# Patient Record
Sex: Male | Born: 1940 | Race: White | Hispanic: No | Marital: Single | Smoking: Former smoker
Health system: Southern US, Community
[De-identification: ages and names within clinical notes are randomized; demographics above are authoritative.]

## PROBLEM LIST (undated history)

## (undated) DIAGNOSIS — J449 Chronic obstructive pulmonary disease, unspecified: Secondary | ICD-10-CM

## (undated) DIAGNOSIS — I10 Essential (primary) hypertension: Secondary | ICD-10-CM

## (undated) DIAGNOSIS — K219 Gastro-esophageal reflux disease without esophagitis: Secondary | ICD-10-CM

## (undated) DIAGNOSIS — M1990 Unspecified osteoarthritis, unspecified site: Secondary | ICD-10-CM

## (undated) DIAGNOSIS — J189 Pneumonia, unspecified organism: Secondary | ICD-10-CM

## (undated) DIAGNOSIS — E7800 Pure hypercholesterolemia, unspecified: Secondary | ICD-10-CM

## (undated) HISTORY — PX: COLONOSCOPY: SHX174

---

## 2012-07-18 HISTORY — PX: PROSTATE SURGERY: SHX751

## 2012-07-21 ENCOUNTER — Emergency Department (HOSPITAL_BASED_OUTPATIENT_CLINIC_OR_DEPARTMENT_OTHER)
Admission: EM | Admit: 2012-07-21 | Discharge: 2012-07-21 | Disposition: A | Discharge status: Home / Self Care | Payer: Medicare Other | Attending: Emergency Medicine | Admitting: Emergency Medicine

## 2012-07-21 ENCOUNTER — Encounter (HOSPITAL_BASED_OUTPATIENT_CLINIC_OR_DEPARTMENT_OTHER): Payer: Self-pay | Admitting: Emergency Medicine

## 2012-07-21 DIAGNOSIS — R002 Palpitations: Secondary | ICD-10-CM | POA: Insufficient documentation

## 2012-07-21 DIAGNOSIS — I959 Hypotension, unspecified: Secondary | ICD-10-CM

## 2012-07-21 DIAGNOSIS — Z87891 Personal history of nicotine dependence: Secondary | ICD-10-CM | POA: Insufficient documentation

## 2012-07-21 DIAGNOSIS — N289 Disorder of kidney and ureter, unspecified: Secondary | ICD-10-CM

## 2012-07-21 DIAGNOSIS — E860 Dehydration: Secondary | ICD-10-CM | POA: Insufficient documentation

## 2012-07-21 DIAGNOSIS — R000 Tachycardia, unspecified: Secondary | ICD-10-CM

## 2012-07-21 DIAGNOSIS — Z8739 Personal history of other diseases of the musculoskeletal system and connective tissue: Secondary | ICD-10-CM | POA: Insufficient documentation

## 2012-07-21 DIAGNOSIS — I10 Essential (primary) hypertension: Secondary | ICD-10-CM | POA: Insufficient documentation

## 2012-07-21 DIAGNOSIS — D649 Anemia, unspecified: Secondary | ICD-10-CM

## 2012-07-21 DIAGNOSIS — E119 Type 2 diabetes mellitus without complications: Secondary | ICD-10-CM | POA: Insufficient documentation

## 2012-07-21 DIAGNOSIS — K219 Gastro-esophageal reflux disease without esophagitis: Secondary | ICD-10-CM | POA: Insufficient documentation

## 2012-07-21 DIAGNOSIS — Z79899 Other long term (current) drug therapy: Secondary | ICD-10-CM | POA: Insufficient documentation

## 2012-07-21 HISTORY — DX: Gastro-esophageal reflux disease without esophagitis: K21.9

## 2012-07-21 HISTORY — DX: Essential (primary) hypertension: I10

## 2012-07-21 HISTORY — DX: Unspecified osteoarthritis, unspecified site: M19.90

## 2012-07-21 LAB — CBC WITH DIFFERENTIAL/PLATELET
Basophils Absolute: 0 10*3/uL (ref 0.0–0.1)
Basophils Relative: 0 % (ref 0–1)
Eosinophils Absolute: 0.3 10*3/uL (ref 0.0–0.7)
HCT: 26.7 % — ABNORMAL LOW (ref 39.0–52.0)
MCHC: 34.1 g/dL (ref 30.0–36.0)
Monocytes Absolute: 1 10*3/uL (ref 0.1–1.0)
Neutro Abs: 8.4 10*3/uL — ABNORMAL HIGH (ref 1.7–7.7)
Neutrophils Relative %: 74 % (ref 43–77)
RDW: 13.1 % (ref 11.5–15.5)

## 2012-07-21 LAB — BASIC METABOLIC PANEL
BUN: 31 mg/dL — ABNORMAL HIGH (ref 6–23)
Calcium: 9.3 mg/dL (ref 8.4–10.5)
GFR calc Af Amer: 40 mL/min — ABNORMAL LOW (ref >90)
GFR calc non Af Amer: 34 mL/min — ABNORMAL LOW (ref >90)
Potassium: 3.9 mEq/L (ref 3.5–5.1)

## 2012-07-21 LAB — GLUCOSE, CAPILLARY: Glucose-Capillary: 136 mg/dL — ABNORMAL HIGH (ref 70–99)

## 2012-07-21 MED ORDER — SODIUM CHLORIDE 0.9 % IV BOLUS (SEPSIS)
1000.0000 mL | Freq: Once | INTRAVENOUS | Status: AC
  Administered 2012-07-21: 1000 mL via INTRAVENOUS

## 2012-07-21 MED ORDER — SODIUM CHLORIDE 0.9 % IV SOLN: Freq: Once | INTRAVENOUS | Status: DC

## 2012-07-21 NOTE — ED Notes (Signed)
Pt d/c prior to infusion needed.  Pt rec'd bolus

## 2012-07-21 NOTE — ED Notes (Signed)
Antony Contras, NP for [facility] called to return a page by pt or family member.

## 2012-07-21 NOTE — ED Provider Notes (Addendum)
History   This chart was scribed for Richard Booze, MD by Sofie Rower. The patient was seen in room MH04/MH04 and the patient's care was started at 6:47PM    CSN: [id number]  Arrival date & time 07/21/12  1737   First MD Initiated Contact with Patient 07/21/12 1847      Chief Complaint  Patient presents with  . Tachycardia  . Hypotension    (Consider location/radiation/quality/duration/timing/severity/associated sxs/prior treatment) The history is provided by the patient. No language interpreter was used.    Kaitlin Ardito is a 71 y.o. male , with a hx of reduction of the prostate on 07/18/12 , who presents to the Emergency Department complaining of sudden, progressively worsening, tachycardia, onset today, with associated symptoms of dizziness and nausea. The pt reports he woke up this morning and let his dogs out, he sat down, and woke up three hours later at 12:00 noon. The pt informs that later this afternoon, he took his blood pressure before going out, and noticed it was low along with a rapid heart rate of 112. The pt has a hx of diabetes and hypertension.   The pt denies chest pain and difficulty breathing.   The pt does not smoke or drink alcohol.   PCP is Dr. Tresa Endo.    Past Medical History  Diagnosis Date  . Diabetes mellitus   . Hypertension   . Arthritis   . Acid reflux     Past Surgical History  Procedure Date  . Prostate surgery Sept. 5 2013    History reviewed. No pertinent family history.  History  Substance Use Topics  . Smoking status: Former Smoker -- 3.0 packs/day for 25 years    Types: Cigarettes    Quit date: 07/22/1983  . Smokeless tobacco: Not on file  . Alcohol Use:       Review of Systems  All other systems reviewed and are negative.    Allergies  Sulfa antibiotics  Home Medications   Current Outpatient Rx  Name Route Sig Dispense Refill  . ACETAMINOPHEN-CODEINE #3 300-30 MG PO TABS Oral Take 1-2 tablets by mouth every 6 (six) hours as  needed. For pain    . AZELASTINE HCL 137 MCG/SPRAY NA SOLN Nasal Place 2 sprays into the nose daily. Use in each nostril as directed    . CIPROFLOXACIN HCL 500 MG PO TABS Oral Take 500 mg by mouth 2 (two) times daily. For 10 days starting 07/20/12    . CLOTRIMAZOLE-BETAMETHASONE 1-0.05 % EX CREA Topical Apply 1 application topically 3 (three) times daily.    . CYCLOBENZAPRINE HCL 10 MG PO TABS Oral Take 5-10 mg by mouth 3 (three) times daily as needed. For back spasms    . STOOL SOFTENER PO Oral Take 1 capsule by mouth daily.    Marland Kitchen ESCITALOPRAM OXALATE 10 MG PO TABS Oral Take 10 mg by mouth daily.    Marland Kitchen FIBER PO TABS Oral Take 1 tablet by mouth daily.    Marland Kitchen FLUTICASONE PROPIONATE 50 MCG/ACT NA SUSP Nasal Place 2 sprays into the nose daily.    Marland Kitchen LEVOTHYROXINE SODIUM 50 MCG PO TABS Oral Take 50 mcg by mouth daily.    Marland Kitchen LISINOPRIL-HYDROCHLOROTHIAZIDE 10-12.5 MG PO TABS Oral Take 1 tablet by mouth daily.    Marland Kitchen LORATADINE 10 MG PO TABS Oral Take 10 mg by mouth daily.    Marland Kitchen LOVASTATIN 20 MG PO TABS Oral Take 20 mg by mouth at bedtime.    Marland Kitchen METFORMIN HCL 500  MG PO TABS Oral Take 500 mg by mouth daily.    Marland Kitchen OMEPRAZOLE 20 MG PO CPDR Oral Take 20 mg by mouth daily.    . OXYBUTYNIN CHLORIDE 5 MG PO TABS Oral Take 5 mg by mouth 2 (two) times daily.    Georgiann Mccoy COLD & COUGH PO Oral Take 1 capsule by mouth every 8 (eight) hours as needed. For cough and cold    . METAMUCIL PO Oral Take 15 g by mouth daily.    Marland Kitchen TAMSULOSIN HCL 0.4 MG PO CAPS Oral Take 0.4 mg by mouth at bedtime.    Marland Kitchen ZOLPIDEM TARTRATE 10 MG PO TABS Oral Take 10 mg by mouth at bedtime as needed. For sleep      BP 88/61  Pulse 116  Temp 98.4 F (36.9 C) (Oral)  Resp 20  SpO2 94%  Physical Exam  Nursing note and vitals reviewed. Constitutional: He is oriented to person, place, and time. He appears well-developed and well-nourished.  HENT:  Head: Atraumatic.  Nose: Nose normal.  Eyes: Conjunctivae and EOM are normal.  Neck: Normal range of  motion. Neck supple.  Cardiovascular: Normal rate, regular rhythm and normal heart sounds.   Pulmonary/Chest: Effort normal and breath sounds normal.  Abdominal: Soft. Bowel sounds are normal.  Genitourinary:       Foley catheter in place straining bloody urine.   Musculoskeletal: Normal range of motion.  Neurological: He is alert and oriented to person, place, and time.  Skin: Skin is warm and dry.  Psychiatric: He has a normal mood and affect. His behavior is normal.    ED Course  Procedures (including critical care time)  DIAGNOSTIC STUDIES: Oxygen Saturation is 94% on room air, adequate by my interpretation.    COORDINATION OF CARE:    6:54PM- Blood loss following prostate surgery, EKG results, and blood work discussed. Pt agrees to treatment.   9:51PM- Recheck. Laboratory results and follow up with urologist discussed. Pt agrees with treatment.   Results for orders placed during the hospital encounter of 07/21/12  GLUCOSE, CAPILLARY      Component Value Range   Glucose-Capillary 136 (*) 70 - 99 mg/dL  CBC WITH DIFFERENTIAL      Component Value Range   WBC 11.3 (*) 4.0 - 10.5 K/uL   RBC 2.89 (*) 4.22 - 5.81 MIL/uL   Hemoglobin 9.1 (*) 13.0 - 17.0 g/dL   HCT 09.8 (*) 11.9 - 14.7 %   MCV 92.4  78.0 - 100.0 fL   MCH 31.5  26.0 - 34.0 pg   MCHC 34.1  30.0 - 36.0 g/dL   RDW 82.9  56.2 - 13.0 %   Platelets 194  150 - 400 K/uL   Neutrophils Relative 74  43 - 77 %   Neutro Abs 8.4 (*) 1.7 - 7.7 K/uL   Lymphocytes Relative 15  12 - 46 %   Lymphs Abs 1.7  0.7 - 4.0 K/uL   Monocytes Relative 8  3 - 12 %   Monocytes Absolute 1.0  0.1 - 1.0 K/uL   Eosinophils Relative 2  0 - 5 %   Eosinophils Absolute 0.3  0.0 - 0.7 K/uL   Basophils Relative 0  0 - 1 %   Basophils Absolute 0.0  0.0 - 0.1 K/uL   WBC Morphology TOXIC GRANULATION    BASIC METABOLIC PANEL      Component Value Range   Sodium 132 (*) 135 - 145 mEq/L   Potassium 3.9  3.5 - 5.1 mEq/L   Chloride 94 (*) 96 - 112  mEq/L   CO2 24  19 - 32 mEq/L   Glucose, Bld 146 (*) 70 - 99 mg/dL   BUN 31 (*) 6 - 23 mg/dL   Creatinine, Ser 4.69 (*) 0.50 - 1.35 mg/dL   Calcium 9.3  8.4 - 62.9 mg/dL   GFR calc non Af Amer 34 (*) >90 mL/min   GFR calc Af Amer 40 (*) >90 mL/min    Date: 07/21/2012  Rate: 111  Rhythm: sinus tachycardia  QRS Axis: normal  Intervals: normal  ST/T Wave abnormalities: normal  Conduction Disutrbances:none  Narrative Interpretation: Sinus tachycardia, otherwise normal ECG. No prior ECGs available for comparison.  Old EKG Reviewed: none available   1. Dehydration   2. Hypotension   3. Tachycardia   4. Anemia   5. Renal insufficiency       MDM  Hypotension and tachycardia most likely were related to blood loss from recent prostate surgery. He'll be given a fluid bolus and orthostatic vital signs checked.  Orthostatic vital signs showed little change with going from supine to standing. Heart rate has improved and blood pressures improved somewhat with fluid bolus. Workup shows mild anemia, but no prior hemoglobins in our system. Also, he has mild renal insufficiency with no prior values to compare with that. He'll be given a second fluid bolus, and I anticipate sending him home following that.   Following his second liter of IV fluid, blood pressure is up to 102 systolic, and heart rate is down to 100. Hemoglobin has come back 9.1 which is not unexpected following prostate surgery. He will need to follow up with his urologist. Has an appointment for 3 days from now, but is advised to contact his urologist tomorrow and let him know what the hemoglobin was tonight.   I personally performed the services described in this documentation, which was scribed in my presence. The recorded information has been reviewed and considered.      Richard Booze, MD 07/21/12 [id number]  Richard Booze, MD 07/21/12 2156

## 2012-07-21 NOTE — ED Notes (Addendum)
Pt complains of low BP and tachycardia since this morning. Recent prostate reduction (Sept. 5 ).

## 2014-12-18 ENCOUNTER — Emergency Department (HOSPITAL_BASED_OUTPATIENT_CLINIC_OR_DEPARTMENT_OTHER): Payer: No Typology Code available for payment source

## 2014-12-18 ENCOUNTER — Encounter (HOSPITAL_BASED_OUTPATIENT_CLINIC_OR_DEPARTMENT_OTHER): Payer: Self-pay | Admitting: Emergency Medicine

## 2014-12-18 ENCOUNTER — Emergency Department (HOSPITAL_BASED_OUTPATIENT_CLINIC_OR_DEPARTMENT_OTHER)
Admission: EM | Admit: 2014-12-18 | Discharge: 2014-12-18 | Disposition: A | Discharge status: Home / Self Care | Payer: No Typology Code available for payment source | Attending: Emergency Medicine | Admitting: Emergency Medicine

## 2014-12-18 DIAGNOSIS — Z792 Long term (current) use of antibiotics: Secondary | ICD-10-CM | POA: Diagnosis not present

## 2014-12-18 DIAGNOSIS — Z8739 Personal history of other diseases of the musculoskeletal system and connective tissue: Secondary | ICD-10-CM | POA: Diagnosis not present

## 2014-12-18 DIAGNOSIS — Z87891 Personal history of nicotine dependence: Secondary | ICD-10-CM | POA: Insufficient documentation

## 2014-12-18 DIAGNOSIS — E119 Type 2 diabetes mellitus without complications: Secondary | ICD-10-CM | POA: Diagnosis not present

## 2014-12-18 DIAGNOSIS — S0990XA Unspecified injury of head, initial encounter: Secondary | ICD-10-CM | POA: Insufficient documentation

## 2014-12-18 DIAGNOSIS — E780 Pure hypercholesterolemia: Secondary | ICD-10-CM | POA: Insufficient documentation

## 2014-12-18 DIAGNOSIS — K219 Gastro-esophageal reflux disease without esophagitis: Secondary | ICD-10-CM | POA: Insufficient documentation

## 2014-12-18 DIAGNOSIS — Y92410 Unspecified street and highway as the place of occurrence of the external cause: Secondary | ICD-10-CM | POA: Diagnosis not present

## 2014-12-18 DIAGNOSIS — Y998 Other external cause status: Secondary | ICD-10-CM | POA: Diagnosis not present

## 2014-12-18 DIAGNOSIS — Y9389 Activity, other specified: Secondary | ICD-10-CM | POA: Diagnosis not present

## 2014-12-18 DIAGNOSIS — Z7951 Long term (current) use of inhaled steroids: Secondary | ICD-10-CM | POA: Diagnosis not present

## 2014-12-18 DIAGNOSIS — I10 Essential (primary) hypertension: Secondary | ICD-10-CM | POA: Insufficient documentation

## 2014-12-18 HISTORY — DX: Pure hypercholesterolemia, unspecified: E78.00

## 2014-12-18 MED ORDER — ACETAMINOPHEN-CODEINE #3 300-30 MG PO TABS: 1.0000 | ORAL_TABLET | Freq: Four times a day (QID) | ORAL | Status: DC | PRN

## 2014-12-18 MED ORDER — ACETAMINOPHEN-CODEINE #3 300-30 MG PO TABS: 2.0000 | ORAL_TABLET | Freq: Once | ORAL | Status: DC

## 2014-12-18 MED ORDER — ACETAMINOPHEN-CODEINE #3 300-30 MG PO TABS
2.0000 | ORAL_TABLET | Freq: Once | ORAL | Status: AC
  Administered 2014-12-18: 2 via ORAL
  Filled 2014-12-18: qty 2
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
## 2014-12-18 NOTE — Discharge Instructions (Signed)
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
## 2014-12-18 NOTE — ED Notes (Signed)
Patient transported to CT 

## 2015-03-16 ENCOUNTER — Encounter (HOSPITAL_BASED_OUTPATIENT_CLINIC_OR_DEPARTMENT_OTHER): Payer: Self-pay | Admitting: Emergency Medicine

## 2015-03-16 ENCOUNTER — Emergency Department (HOSPITAL_BASED_OUTPATIENT_CLINIC_OR_DEPARTMENT_OTHER): Payer: Medicare Other

## 2015-03-16 ENCOUNTER — Inpatient Hospital Stay (HOSPITAL_BASED_OUTPATIENT_CLINIC_OR_DEPARTMENT_OTHER)
Admission: EM | Admit: 2015-03-16 | Discharge: 2015-03-17 | DRG: 202 | Disposition: A | Discharge status: Home / Self Care | Payer: Medicare Other | Attending: Internal Medicine | Admitting: Internal Medicine

## 2015-03-16 DIAGNOSIS — M1990 Unspecified osteoarthritis, unspecified site: Secondary | ICD-10-CM | POA: Diagnosis present

## 2015-03-16 DIAGNOSIS — E119 Type 2 diabetes mellitus without complications: Secondary | ICD-10-CM

## 2015-03-16 DIAGNOSIS — K219 Gastro-esophageal reflux disease without esophagitis: Secondary | ICD-10-CM | POA: Diagnosis present

## 2015-03-16 DIAGNOSIS — E785 Hyperlipidemia, unspecified: Secondary | ICD-10-CM | POA: Diagnosis present

## 2015-03-16 DIAGNOSIS — Z87891 Personal history of nicotine dependence: Secondary | ICD-10-CM

## 2015-03-16 DIAGNOSIS — J9601 Acute respiratory failure with hypoxia: Secondary | ICD-10-CM | POA: Diagnosis present

## 2015-03-16 DIAGNOSIS — J45901 Unspecified asthma with (acute) exacerbation: Secondary | ICD-10-CM | POA: Diagnosis not present

## 2015-03-16 DIAGNOSIS — J9600 Acute respiratory failure, unspecified whether with hypoxia or hypercapnia: Secondary | ICD-10-CM | POA: Diagnosis present

## 2015-03-16 DIAGNOSIS — N183 Chronic kidney disease, stage 3 (moderate): Secondary | ICD-10-CM | POA: Diagnosis present

## 2015-03-16 DIAGNOSIS — I10 Essential (primary) hypertension: Secondary | ICD-10-CM | POA: Diagnosis not present

## 2015-03-16 DIAGNOSIS — D649 Anemia, unspecified: Secondary | ICD-10-CM | POA: Diagnosis present

## 2015-03-16 DIAGNOSIS — E039 Hypothyroidism, unspecified: Secondary | ICD-10-CM | POA: Diagnosis present

## 2015-03-16 DIAGNOSIS — Z79899 Other long term (current) drug therapy: Secondary | ICD-10-CM | POA: Diagnosis not present

## 2015-03-16 DIAGNOSIS — R0602 Shortness of breath: Secondary | ICD-10-CM

## 2015-03-16 DIAGNOSIS — I129 Hypertensive chronic kidney disease with stage 1 through stage 4 chronic kidney disease, or unspecified chronic kidney disease: Secondary | ICD-10-CM | POA: Diagnosis present

## 2015-03-16 DIAGNOSIS — R05 Cough: Secondary | ICD-10-CM | POA: Diagnosis present

## 2015-03-16 DIAGNOSIS — R058 Other specified cough: Secondary | ICD-10-CM | POA: Diagnosis present

## 2015-03-16 LAB — CBC WITH DIFFERENTIAL/PLATELET
BASOS ABS: 0 10*3/uL (ref 0.0–0.1)
Basophils Relative: 0 % (ref 0–1)
EOS ABS: 0.1 10*3/uL (ref 0.0–0.7)
Eosinophils Relative: 1 % (ref 0–5)
HCT: 36.5 % — ABNORMAL LOW (ref 39.0–52.0)
Hemoglobin: 12.6 g/dL — ABNORMAL LOW (ref 13.0–17.0)
LYMPHS ABS: 2.3 10*3/uL (ref 0.7–4.0)
Lymphocytes Relative: 16 % (ref 12–46)
MCH: 30.4 pg (ref 26.0–34.0)
MCHC: 34.5 g/dL (ref 30.0–36.0)
MCV: 88.2 fL (ref 78.0–100.0)
MONO ABS: 1.2 10*3/uL — AB (ref 0.1–1.0)
Monocytes Relative: 8 % (ref 3–12)
Neutro Abs: 10.8 10*3/uL — ABNORMAL HIGH (ref 1.7–7.7)
Neutrophils Relative %: 75 % (ref 43–77)
PLATELETS: 200 10*3/uL (ref 150–400)
RBC: 4.14 MIL/uL — ABNORMAL LOW (ref 4.22–5.81)
RDW: 13.9 % (ref 11.5–15.5)
WBC: 14.4 10*3/uL — ABNORMAL HIGH (ref 4.0–10.5)

## 2015-03-16 LAB — I-STAT ARTERIAL BLOOD GAS, ED
Acid-base deficit: 5 mmol/L — ABNORMAL HIGH (ref 0.0–2.0)
Bicarbonate: 18.4 mEq/L — ABNORMAL LOW (ref 20.0–24.0)
O2 Saturation: 92 %
PCO2 ART: 31.1 mmHg — AB (ref 35.0–45.0)
PH ART: 7.381 (ref 7.350–7.450)
TCO2: 19 mmol/L (ref 0–100)
pO2, Arterial: 66 mmHg — ABNORMAL LOW (ref 80.0–100.0)

## 2015-03-16 LAB — CBG MONITORING, ED: Glucose-Capillary: 128 mg/dL — ABNORMAL HIGH (ref 70–99)

## 2015-03-16 LAB — BASIC METABOLIC PANEL
ANION GAP: 11 (ref 5–15)
BUN: 27 mg/dL — ABNORMAL HIGH (ref 6–20)
CALCIUM: 8.8 mg/dL — AB (ref 8.9–10.3)
CO2: 22 mmol/L (ref 22–32)
Chloride: 97 mmol/L — ABNORMAL LOW (ref 101–111)
Creatinine, Ser: 1.27 mg/dL — ABNORMAL HIGH (ref 0.61–1.24)
GFR calc Af Amer: >60 mL/min (ref >60)
GFR calc non Af Amer: 55 mL/min — ABNORMAL LOW (ref >60)
Glucose, Bld: 150 mg/dL — ABNORMAL HIGH (ref 70–99)
Potassium: 3.7 mmol/L (ref 3.5–5.1)
Sodium: 130 mmol/L — ABNORMAL LOW (ref 135–145)

## 2015-03-16 MED ORDER — ALBUTEROL SULFATE (2.5 MG/3ML) 0.083% IN NEBU
5.0000 mg | INHALATION_SOLUTION | Freq: Once | RESPIRATORY_TRACT | Status: AC
  Administered 2015-03-16: 5 mg via RESPIRATORY_TRACT
  Filled 2015-03-16: qty 6

## 2015-03-16 MED ORDER — DEXTROSE 5 % IV SOLN: 500.0000 mg | Freq: Once | INTRAVENOUS | Status: AC

## 2015-03-16 MED ORDER — PREDNISONE 50 MG PO TABS
60.0000 mg | ORAL_TABLET | Freq: Once | ORAL | Status: AC
  Administered 2015-03-16: 60 mg via ORAL
  Filled 2015-03-16 (×2): qty 1

## 2015-03-16 MED ORDER — DEXTROSE 5 % IV SOLN: 1.0000 g | Freq: Once | INTRAVENOUS | Status: AC

## 2015-03-16 MED ORDER — AZITHROMYCIN 500 MG IV SOLR
INTRAVENOUS | Status: AC
  Administered 2015-03-16: 500 mg
  Filled 2015-03-16: qty 500

## 2015-03-16 MED ORDER — CEFTRIAXONE SODIUM 1 G IJ SOLR
INTRAMUSCULAR | Status: AC
  Administered 2015-03-16: 1000 mg
  Filled 2015-03-16: qty 10

## 2015-03-16 NOTE — ED Notes (Signed)
Ambulated patient per Dr. Fredderick PhenixBelfi. Patient placed on pulse ox and ambulated around department. Patient oxygen saturations were 96% at start of walk and decreased to 82% with noticeable dyspnea with a heart rate of 113. Notified physician.

## 2015-03-16 NOTE — ED Notes (Signed)
Pt unable to do peak flow. Every deep breath patient coughs.

## 2015-03-16 NOTE — ED Provider Notes (Addendum)
CSN: [id number]     Arrival date & time 03/16/15  1801 History  This chart was scribed for Rolan Bucco, MD by Abel Presto, ED Scribe. This patient was seen in room MH11/MH11 and the patient's care was started at 6:30 PM.    Chief Complaint  Patient presents with  . Shortness of Breath    Patient is a 74 y.o. male presenting with shortness of breath. The history is provided by the patient. No language interpreter was used.  Shortness of Breath Associated symptoms: cough and wheezing   Associated symptoms: no abdominal pain, no chest pain, no diaphoresis, no fever, no headaches, no rash and no vomiting    HPI Comments: Richard Collier is a 74 y.o. male with PMHx of DM, HTN, arthritis, and HLD who presents to the Emergency Department complaining of SOB with onset 7 days ago. Pt states he was mowing a lawn at onset. Pt states productive cough began the next day but has partially resolved since. He reports associated chest wall pain from cough which has partially resolved.  Pt has been taking cough syrup for relief. Pt was seen by PCP today for annual physical with XR done (see below) and sent to ED with concern for pneumonia. Per nursing note pt received 1 nebulizer treatment and oxygen during appointment. Pt has been using albuterol inhaler and Advair daily. Pt reports his O2 stats have always been low. Pt denies h/o lung disease, emphysema, asthma, COPD. Pt is not a current smoker. Pt denies fever, vomiting, current chest pain, pain or swelling in bilateral legs.  CXR: done 5/3 "Mediastinum and hilar structures normal. Bilateral diffuse pulmonary interstitial prominence noted consistent pneumonitis. No pleural effusion or pneumothorax. Heart size normal.  IMPRESSION: Bilateral diffuse pulmonary interstitial prominence noted consistent with pneumonitis."   Past Medical History  Diagnosis Date  . Diabetes mellitus   . Hypertension   . Arthritis   . Acid reflux   . High cholesterol    Past  Surgical History  Procedure Laterality Date  . Prostate surgery  Sept. 5 2013  . Colonoscopy     No family history on file. History  Substance Use Topics  . Smoking status: Former Smoker -- 3.00 packs/day for 25 years    Types: Cigarettes    Quit date: 07/22/1983  . Smokeless tobacco: Not on file  . Alcohol Use: No    Review of Systems  Constitutional: Positive for fatigue. Negative for fever, chills and diaphoresis.  HENT: Negative for congestion, rhinorrhea and sneezing.   Eyes: Negative.   Respiratory: Positive for cough, shortness of breath and wheezing. Negative for chest tightness.   Cardiovascular: Negative for chest pain and leg swelling.  Gastrointestinal: Negative for nausea, vomiting, abdominal pain, diarrhea and blood in stool.  Genitourinary: Negative for frequency, hematuria, flank pain and difficulty urinating.  Musculoskeletal: Negative for back pain and arthralgias.  Skin: Negative for rash.  Neurological: Negative for dizziness, speech difficulty, weakness, numbness and headaches.      Allergies  Sulfa antibiotics  Home Medications   Prior to Admission medications   Medication Sig Start Date End Date Taking? Authorizing Provider  acetaminophen-codeine (TYLENOL #3) 300-30 MG per tablet Take 1-2 tablets by mouth every 6 (six) hours as needed (for pain). For pain 12/18/14   Paula Libra, MD  acetaminophen-codeine (TYLENOL #3) 300-30 MG per tablet Take 2 tablets by mouth once. 12/18/14   Paula Libra, MD  azelastine (ASTELIN) 137 MCG/SPRAY nasal spray Place 2 sprays into the nose  daily. Use in each nostril as directed    Historical Provider, MD  ciprofloxacin (CIPRO) 500 MG tablet Take 500 mg by mouth 2 (two) times daily. For 10 days starting 07/20/12    Historical Provider, MD  clotrimazole-betamethasone (LOTRISONE) cream Apply 1 application topically 3 (three) times daily.    Historical Provider, MD  cyclobenzaprine (FLEXERIL) 10 MG tablet Take 5-10 mg by mouth 3  (three) times daily as needed. For back spasms    Historical Provider, MD  Docusate Calcium (STOOL SOFTENER PO) Take 1 capsule by mouth daily.    Historical Provider, MD  escitalopram (LEXAPRO) 10 MG tablet Take 10 mg by mouth daily.    Historical Provider, MD  Fiber TABS Take 1 tablet by mouth daily.    Historical Provider, MD  fluticasone (FLONASE) 50 MCG/ACT nasal spray Place 2 sprays into the nose daily.    Historical Provider, MD  levothyroxine (SYNTHROID, LEVOTHROID) 50 MCG tablet Take 50 mcg by mouth daily.    Historical Provider, MD  lisinopril-hydrochlorothiazide (PRINZIDE,ZESTORETIC) 10-12.5 MG per tablet Take 1 tablet by mouth daily.    Historical Provider, MD  loratadine (CLARITIN) 10 MG tablet Take 10 mg by mouth daily.    Historical Provider, MD  lovastatin (MEVACOR) 20 MG tablet Take 20 mg by mouth at bedtime.    Historical Provider, MD  metFORMIN (GLUCOPHAGE) 500 MG tablet Take 500 mg by mouth daily.    Historical Provider, MD  omeprazole (PRILOSEC) 20 MG capsule Take 20 mg by mouth daily.    Historical Provider, MD  oxybutynin (DITROPAN) 5 MG tablet Take 5 mg by mouth 2 (two) times daily.    Historical Provider, MD  Pseudoephedrine-APAP-DM (COMTREX COLD & COUGH PO) Take 1 capsule by mouth every 8 (eight) hours as needed. For cough and cold    Historical Provider, MD  Psyllium (METAMUCIL PO) Take 15 g by mouth daily.    Historical Provider, MD  Tamsulosin HCl (FLOMAX) 0.4 MG CAPS Take 0.4 mg by mouth at bedtime.    Historical Provider, MD  zolpidem (AMBIEN) 10 MG tablet Take 10 mg by mouth at bedtime as needed. For sleep    Historical Provider, MD   BP 138/81 mmHg  Pulse 105  Temp(Src) 99.1 F (37.3 C) (Oral)  Resp 18  Ht  (1.803 m)  Wt 233 lb (105.688 kg)  BMI 32.51 kg/m2  SpO2 94% Physical Exam  Constitutional: He is oriented to person, place, and time. He appears well-developed and well-nourished.  HENT:  Head: Normocephalic and atraumatic.  Eyes: Pupils are  equal, round, and reactive to light.  Neck: Normal range of motion. Neck supple.  Cardiovascular: Normal rate, regular rhythm and normal heart sounds.   Pulmonary/Chest: Effort normal. No respiratory distress. He has wheezes. He has no rales. He exhibits no tenderness.  Positive rhonchi bilaterally with some moderate expiratory wheezing. There some mild increased work of breathing  Abdominal: Soft. Bowel sounds are normal. There is no tenderness. There is no rebound and no guarding.  Musculoskeletal: Normal range of motion. He exhibits edema.  Patient has trace edema bilaterally. No calf tenderness  Lymphadenopathy:    He has no cervical adenopathy.  Neurological: He is alert and oriented to person, place, and time.  Skin: Skin is warm and dry. No rash noted.  Psychiatric: He has a normal mood and affect.    ED Course  Procedures (including critical care time) DIAGNOSTIC STUDIES: Oxygen Saturation is 93% on room air, adequate by my interpretation.  COORDINATION OF CARE: 6:42 PM Discussed treatment plan with patient at beside, the patient agrees with the plan and has no further questions at this time.   Labs Review Labs Reviewed  BASIC METABOLIC PANEL - Abnormal; Notable for the following:    Sodium 130 (*)    Chloride 97 (*)    Glucose, Bld 150 (*)    BUN 27 (*)    Creatinine, Ser 1.27 (*)    Calcium 8.8 (*)    GFR calc non Af Amer 55 (*)    All other components within normal limits  CBC WITH DIFFERENTIAL/PLATELET - Abnormal; Notable for the following:    WBC 14.4 (*)    RBC 4.14 (*)    Hemoglobin 12.6 (*)    HCT 36.5 (*)    Neutro Abs 10.8 (*)    Monocytes Absolute 1.2 (*)    All other components within normal limits  CBG MONITORING, ED - Abnormal; Notable for the following:    Glucose-Capillary 128 (*)    All other components within normal limits  I-STAT ARTERIAL BLOOD GAS, ED - Abnormal; Notable for the following:    pCO2 arterial 31.1 (*)    pO2, Arterial 66.0 (*)     Bicarbonate 18.4 (*)    Acid-base deficit 5.0 (*)    All other components within normal limits    Imaging Review No results found.   EKG Interpretation   Date/Time:  Tuesday Mar 16 2015 23:14:36 EDT Ventricular Rate:  105 PR Interval:  200 QRS Duration: 100 QT Interval:  336 QTC Calculation: 444 R Axis:   4 Text Interpretation:  Sinus tachycardia Otherwise normal ECG since last  tracing no significant change Confirmed by Shaeleigh Graw  MD, Priscella Donna ([id number]) on  03/16/2015 11:16:44 PM     Results for orders placed or performed during the hospital encounter of 03/16/15  Basic metabolic panel  Result Value Ref Range   Sodium 130 (L) 135 - 145 mmol/L   Potassium 3.7 3.5 - 5.1 mmol/L   Chloride 97 (L) 101 - 111 mmol/L   CO2 22 22 - 32 mmol/L   Glucose, Bld 150 (H) 70 - 99 mg/dL   BUN 27 (H) 6 - 20 mg/dL   Creatinine, Ser 2.951.27 (H) 0.61 - 1.24 mg/dL   Calcium 8.8 (L) 8.9 - 10.3 mg/dL   GFR calc non Af Amer 55 (L) >60 mL/min   GFR calc Af Amer >60 >60 mL/min   Anion gap 11 5 - 15  CBC with Differential  Result Value Ref Range   WBC 14.4 (H) 4.0 - 10.5 K/uL   RBC 4.14 (L) 4.22 - 5.81 MIL/uL   Hemoglobin 12.6 (L) 13.0 - 17.0 g/dL   HCT 62.136.5 (L) 30.839.0 - 65.752.0 %   MCV 88.2 78.0 - 100.0 fL   MCH 30.4 26.0 - 34.0 pg   MCHC 34.5 30.0 - 36.0 g/dL   RDW 84.613.9 96.211.5 - 95.215.5 %   Platelets 200 150 - 400 K/uL   Neutrophils Relative % 75 43 - 77 %   Lymphocytes Relative 16 12 - 46 %   Monocytes Relative 8 3 - 12 %   Eosinophils Relative 1 0 - 5 %   Basophils Relative 0 0 - 1 %   Neutro Abs 10.8 (H) 1.7 - 7.7 K/uL   Lymphs Abs 2.3 0.7 - 4.0 K/uL   Monocytes Absolute 1.2 (H) 0.1 - 1.0 K/uL   Eosinophils Absolute 0.1 0.0 - 0.7 K/uL   Basophils Absolute  0.0 0.0 - 0.1 K/uL  CBG monitoring, ED  Result Value Ref Range   Glucose-Capillary 128 (H) 70 - 99 mg/dL  I-Stat arterial blood gas, ED  Result Value Ref Range   pH, Arterial 7.381 7.350 - 7.450   pCO2 arterial 31.1 (L) 35.0 - 45.0 mmHg    pO2, Arterial 66.0 (L) 80.0 - 100.0 mmHg   Bicarbonate 18.4 (L) 20.0 - 24.0 mEq/L   TCO2 19 0 - 100 mmol/L   O2 Saturation 92.0 %   Acid-base deficit 5.0 (H) 0.0 - 2.0 mmol/L   Patient temperature 99.0 F    Collection site IV START    Drawn by RT    Sample type ARTERIAL    No results found.   Meds ordered this encounter  Medications  . predniSONE (DELTASONE) tablet 60 mg    Sig:   . albuterol (PROVENTIL) (2.5 MG/3ML) 0.083% nebulizer solution 5 mg    Sig:   . albuterol (PROVENTIL) (2.5 MG/3ML) 0.083% nebulizer solution 5 mg    Sig:   . cefTRIAXone (ROCEPHIN) 1 g in dextrose 5 % 50 mL IVPB    Sig:   . azithromycin (ZITHROMAX) 500 mg in dextrose 5 % 250 mL IVPB    Sig:   . azithromycin (ZITHROMAX) 500 MG injection    Sig:     Marcum, Ruth   : cabinet override  . cefTRIAXone (ROCEPHIN) 1 G injection    Sig:     Marcum, Ruth   : cabinet override  . albuterol (PROVENTIL) (2.5 MG/3ML) 0.083% nebulizer solution 5 mg    Sig:     MDM   Final diagnoses:  Asthma exacerbation   Patient presents with wheezing and cough. He's afebrile. He initially did not want to be admitted however he continued to have some mild hypoxia. Even at rest with oxygen saturations would go down to about 87%. He states his baseline sats are low but it's unclear what his baseline sats actually are. However when he ambulates his oxygen saturations drop down 82%. This was even after getting several nebulizer treatments in the ED. He was also given prednisone. He was treated with Zithromax and Rocephin for possible infection. I spoke with Dr. Park Breed with the hospital service who is accepted the patient for transfer to [facility].  I personally performed the services described in this documentation, which was scribed in my presence.  The recorded information has been reviewed and considered.     Rolan Bucco, MD 03/16/15 1610  Rolan Bucco, MD 03/16/15 [id number]

## 2015-03-16 NOTE — ED Notes (Signed)
MD at bedside discussing admission with pt

## 2015-03-16 NOTE — ED Notes (Signed)
Placed patient on 2 liters nasal cannula patient oxygen saturations were 88% placed patient on 2 liters patient oxygen saturations >90%

## 2015-03-16 NOTE — ED Notes (Signed)
Ambulated patient via pulse ox. Patient saturations ranged from 93% to 83% with exertion and dyspnea. Patient returned to room and physician notified

## 2015-03-16 NOTE — ED Notes (Signed)
74 yo c/o SOB and dx with possible Pneumonia from PCP. Pt received 1 nebulizer tx and oxygen at the appointment. Denies Pain and is alert and ambulatory at triage.

## 2015-03-16 NOTE — ED Notes (Signed)
MD at bedside. 

## 2015-03-17 ENCOUNTER — Inpatient Hospital Stay (HOSPITAL_COMMUNITY): Payer: Medicare Other

## 2015-03-17 ENCOUNTER — Encounter (HOSPITAL_COMMUNITY): Payer: Self-pay | Admitting: Internal Medicine

## 2015-03-17 DIAGNOSIS — J9600 Acute respiratory failure, unspecified whether with hypoxia or hypercapnia: Secondary | ICD-10-CM | POA: Diagnosis present

## 2015-03-17 DIAGNOSIS — I10 Essential (primary) hypertension: Secondary | ICD-10-CM | POA: Diagnosis present

## 2015-03-17 DIAGNOSIS — R058 Other specified cough: Secondary | ICD-10-CM | POA: Diagnosis present

## 2015-03-17 DIAGNOSIS — D649 Anemia, unspecified: Secondary | ICD-10-CM | POA: Diagnosis present

## 2015-03-17 DIAGNOSIS — E039 Hypothyroidism, unspecified: Secondary | ICD-10-CM | POA: Diagnosis present

## 2015-03-17 DIAGNOSIS — E119 Type 2 diabetes mellitus without complications: Secondary | ICD-10-CM

## 2015-03-17 DIAGNOSIS — J9601 Acute respiratory failure with hypoxia: Secondary | ICD-10-CM

## 2015-03-17 DIAGNOSIS — E785 Hyperlipidemia, unspecified: Secondary | ICD-10-CM | POA: Diagnosis present

## 2015-03-17 DIAGNOSIS — R05 Cough: Secondary | ICD-10-CM | POA: Diagnosis present

## 2015-03-17 DIAGNOSIS — J45901 Unspecified asthma with (acute) exacerbation: Principal | ICD-10-CM

## 2015-03-17 LAB — URINALYSIS, ROUTINE W REFLEX MICROSCOPIC
BILIRUBIN URINE: NEGATIVE
Glucose, UA: NEGATIVE mg/dL
Hgb urine dipstick: NEGATIVE
KETONES UR: NEGATIVE mg/dL
Leukocytes, UA: NEGATIVE
Nitrite: NEGATIVE
PH: 5.5 (ref 5.0–8.0)
Protein, ur: 30 mg/dL — AB
Specific Gravity, Urine: 1.012 (ref 1.005–1.030)
Urobilinogen, UA: 0.2 mg/dL (ref 0.0–1.0)

## 2015-03-17 LAB — CBC
HCT: 34.4 % — ABNORMAL LOW (ref 39.0–52.0)
Hemoglobin: 11.9 g/dL — ABNORMAL LOW (ref 13.0–17.0)
MCH: 29.8 pg (ref 26.0–34.0)
MCHC: 34.6 g/dL (ref 30.0–36.0)
MCV: 86.2 fL (ref 78.0–100.0)
Platelets: 209 10*3/uL (ref 150–400)
RBC: 3.99 MIL/uL — AB (ref 4.22–5.81)
RDW: 14.1 % (ref 11.5–15.5)
WBC: 16.4 10*3/uL — ABNORMAL HIGH (ref 4.0–10.5)

## 2015-03-17 LAB — CREATININE, SERUM
Creatinine, Ser: 1.23 mg/dL (ref 0.61–1.24)
GFR, EST NON AFRICAN AMERICAN: 57 mL/min — AB (ref >60)

## 2015-03-17 LAB — GLUCOSE, CAPILLARY
Glucose-Capillary: 185 mg/dL — ABNORMAL HIGH (ref 70–99)
Glucose-Capillary: 175 mg/dL — ABNORMAL HIGH (ref 70–99)

## 2015-03-17 LAB — STREP PNEUMONIAE URINARY ANTIGEN: STREP PNEUMO URINARY ANTIGEN: NEGATIVE

## 2015-03-17 LAB — URINE MICROSCOPIC-ADD ON

## 2015-03-17 LAB — MRSA PCR SCREENING: MRSA by PCR: NEGATIVE

## 2015-03-17 LAB — PROCALCITONIN: Procalcitonin: 0.47 ng/mL

## 2015-03-17 LAB — BRAIN NATRIURETIC PEPTIDE: B Natriuretic Peptide: 56.6 pg/mL (ref 0.0–100.0)

## 2015-03-17 MED ORDER — SODIUM CHLORIDE 0.9 % IJ SOLN: 3.0000 mL | Freq: Two times a day (BID) | INTRAMUSCULAR | Status: DC

## 2015-03-17 MED ORDER — GUAIFENESIN-CODEINE 100-10 MG/5ML PO SYRP: 5.0000 mL | ORAL_SOLUTION | Freq: Three times a day (TID) | ORAL | Status: AC | PRN

## 2015-03-17 MED ORDER — LEVOTHYROXINE SODIUM 50 MCG PO TABS
50.0000 ug | ORAL_TABLET | Freq: Every day | ORAL | Status: DC
  Filled 2015-03-17 (×2): qty 1

## 2015-03-17 MED ORDER — ZOLPIDEM TARTRATE 5 MG PO TABS: 5.0000 mg | ORAL_TABLET | Freq: Every evening | ORAL | Status: DC | PRN

## 2015-03-17 MED ORDER — LEVOFLOXACIN 750 MG PO TABS: 750.0000 mg | ORAL_TABLET | Freq: Every day | ORAL | Status: DC

## 2015-03-17 MED ORDER — ALBUTEROL SULFATE HFA 108 (90 BASE) MCG/ACT IN AERS: 2.0000 | INHALATION_SPRAY | Freq: Four times a day (QID) | RESPIRATORY_TRACT | Status: DC | PRN

## 2015-03-17 MED ORDER — ONDANSETRON HCL 4 MG PO TABS: 4.0000 mg | ORAL_TABLET | Freq: Four times a day (QID) | ORAL | Status: DC | PRN

## 2015-03-17 MED ORDER — BUDESONIDE 0.25 MG/2ML IN SUSP
0.2500 mg | Freq: Two times a day (BID) | RESPIRATORY_TRACT | Status: DC
  Administered 2015-03-17: 0.25 mg via RESPIRATORY_TRACT
  Filled 2015-03-17 (×3): qty 2

## 2015-03-17 MED ORDER — PREDNISONE 10 MG PO TABS: ORAL_TABLET | ORAL | Status: DC

## 2015-03-17 MED ORDER — ESCITALOPRAM OXALATE 10 MG PO TABS
10.0000 mg | ORAL_TABLET | Freq: Every day | ORAL | Status: DC
  Administered 2015-03-17: 10 mg via ORAL
  Filled 2015-03-17: qty 1

## 2015-03-17 MED ORDER — LORATADINE 10 MG PO TABS
10.0000 mg | ORAL_TABLET | Freq: Every day | ORAL | Status: DC
  Administered 2015-03-17: 10 mg via ORAL
  Filled 2015-03-17: qty 1

## 2015-03-17 MED ORDER — CYCLOBENZAPRINE HCL 10 MG PO TABS: 5.0000 mg | ORAL_TABLET | Freq: Three times a day (TID) | ORAL | Status: DC | PRN

## 2015-03-17 MED ORDER — PANTOPRAZOLE SODIUM 40 MG PO TBEC
40.0000 mg | DELAYED_RELEASE_TABLET | Freq: Every day | ORAL | Status: DC
  Administered 2015-03-17: 40 mg via ORAL
  Filled 2015-03-17: qty 1

## 2015-03-17 MED ORDER — ALBUTEROL SULFATE (2.5 MG/3ML) 0.083% IN NEBU
2.5000 mg | INHALATION_SOLUTION | Freq: Three times a day (TID) | RESPIRATORY_TRACT | Status: DC
  Filled 2015-03-17: qty 3

## 2015-03-17 MED ORDER — GUAIFENESIN-CODEINE 100-10 MG/5ML PO SOLN
10.0000 mL | Freq: Four times a day (QID) | ORAL | Status: DC | PRN
  Filled 2015-03-17: qty 10

## 2015-03-17 MED ORDER — PREDNISONE 20 MG PO TABS
20.0000 mg | ORAL_TABLET | Freq: Every day | ORAL | Status: DC
  Administered 2015-03-17: 20 mg via ORAL
  Filled 2015-03-17 (×2): qty 1

## 2015-03-17 MED ORDER — ALBUTEROL SULFATE (2.5 MG/3ML) 0.083% IN NEBU: 2.5000 mg | INHALATION_SOLUTION | RESPIRATORY_TRACT | Status: DC | PRN

## 2015-03-17 MED ORDER — OXYBUTYNIN CHLORIDE 5 MG PO TABS
5.0000 mg | ORAL_TABLET | Freq: Two times a day (BID) | ORAL | Status: DC
  Filled 2015-03-17 (×2): qty 1

## 2015-03-17 MED ORDER — FLUTICASONE-SALMETEROL 250-50 MCG/DOSE IN AEPB: 1.0000 | INHALATION_SPRAY | Freq: Two times a day (BID) | RESPIRATORY_TRACT | Status: AC

## 2015-03-17 MED ORDER — LISINOPRIL-HYDROCHLOROTHIAZIDE 10-12.5 MG PO TABS
1.0000 | ORAL_TABLET | Freq: Every day | ORAL | Status: DC
Start: 2015-03-17 — End: 2015-03-17

## 2015-03-17 MED ORDER — TAMSULOSIN HCL 0.4 MG PO CAPS
0.4000 mg | ORAL_CAPSULE | Freq: Every day | ORAL | Status: DC
  Filled 2015-03-17: qty 1

## 2015-03-17 MED ORDER — DOCUSATE SODIUM 100 MG PO CAPS: 100.0000 mg | ORAL_CAPSULE | Freq: Every day | ORAL | Status: DC

## 2015-03-17 MED ORDER — ENOXAPARIN SODIUM 40 MG/0.4ML ~~LOC~~ SOLN
40.0000 mg | SUBCUTANEOUS | Status: DC
  Administered 2015-03-17: 40 mg via SUBCUTANEOUS
  Filled 2015-03-17 (×2): qty 0.4

## 2015-03-17 MED ORDER — FLUTICASONE PROPIONATE 50 MCG/ACT NA SUSP
2.0000 | Freq: Every day | NASAL | Status: DC
  Filled 2015-03-17: qty 16

## 2015-03-17 MED ORDER — PRAVASTATIN SODIUM 20 MG PO TABS
20.0000 mg | ORAL_TABLET | Freq: Every day | ORAL | Status: DC
  Filled 2015-03-17: qty 1

## 2015-03-17 MED ORDER — ACETAMINOPHEN 650 MG RE SUPP: 650.0000 mg | Freq: Four times a day (QID) | RECTAL | Status: DC | PRN

## 2015-03-17 MED ORDER — DEXTROSE 5 % IV SOLN
500.0000 mg | INTRAVENOUS | Status: DC
  Filled 2015-03-17: qty 500

## 2015-03-17 MED ORDER — CETYLPYRIDINIUM CHLORIDE 0.05 % MT LIQD
7.0000 mL | Freq: Two times a day (BID) | OROMUCOSAL | Status: DC
  Administered 2015-03-17: 7 mL via OROMUCOSAL

## 2015-03-17 MED ORDER — GUAIFENESIN 100 MG/5ML PO SYRP
200.0000 mg | ORAL_SOLUTION | Freq: Four times a day (QID) | ORAL | Status: DC | PRN
  Filled 2015-03-17: qty 10

## 2015-03-17 MED ORDER — DEXTROSE 5 % IV SOLN
1.0000 g | INTRAVENOUS | Status: DC
  Filled 2015-03-17: qty 10

## 2015-03-17 MED ORDER — HYDROCHLOROTHIAZIDE 12.5 MG PO CAPS
12.5000 mg | ORAL_CAPSULE | Freq: Every day | ORAL | Status: DC
  Administered 2015-03-17: 12.5 mg via ORAL
  Filled 2015-03-17: qty 1

## 2015-03-17 MED ORDER — ACETAMINOPHEN 325 MG PO TABS
650.0000 mg | ORAL_TABLET | Freq: Four times a day (QID) | ORAL | Status: DC | PRN
  Administered 2015-03-17: 650 mg via ORAL
  Filled 2015-03-17: qty 2

## 2015-03-17 MED ORDER — ONDANSETRON HCL 4 MG/2ML IJ SOLN: 4.0000 mg | Freq: Four times a day (QID) | INTRAMUSCULAR | Status: DC | PRN

## 2015-03-17 MED ORDER — AZELASTINE HCL 0.1 % NA SOLN
2.0000 | Freq: Every day | NASAL | Status: DC
  Filled 2015-03-17: qty 30

## 2015-03-17 MED ORDER — ALBUTEROL SULFATE (2.5 MG/3ML) 0.083% IN NEBU
2.5000 mg | INHALATION_SOLUTION | RESPIRATORY_TRACT | Status: DC
  Administered 2015-03-17 (×2): 2.5 mg via RESPIRATORY_TRACT
  Filled 2015-03-17 (×2): qty 3

## 2015-03-17 MED ORDER — FUROSEMIDE 10 MG/ML IJ SOLN
20.0000 mg | Freq: Once | INTRAMUSCULAR | Status: AC
  Administered 2015-03-17: 20 mg via INTRAVENOUS
  Filled 2015-03-17: qty 2

## 2015-03-17 MED ORDER — INSULIN ASPART 100 UNIT/ML ~~LOC~~ SOLN
0.0000 [IU] | Freq: Three times a day (TID) | SUBCUTANEOUS | Status: DC
  Administered 2015-03-17: 1 [IU] via SUBCUTANEOUS

## 2015-03-17 MED ORDER — LISINOPRIL 10 MG PO TABS
10.0000 mg | ORAL_TABLET | Freq: Every day | ORAL | Status: DC
  Administered 2015-03-17: 10 mg via ORAL
  Filled 2015-03-17: qty 1

## 2015-03-17 NOTE — H&P (Signed)
[facility] History and Physical  Richard PodRay Guttierrez ZOX:[id number] DOB: [birth year] DOA: 03/16/2015  Referring physician: Dr. Fredderick PhenixBelfi. Patient was transferred from [facility] [place]. PCP: Almedia BallsKELLY,SAM, MD  Specialists: None.  Chief Complaint: Persistent cough.  HPI: Richard Collier is a 74 y.o. male with history of diabetes mellitus type 2, hypertension, hyperlipidemia, hypothyroidism has been expressing persistent cough over the last 1 week. Patient's symptoms started last week after mowing. Patient states he has been having persistent productive cough and had gone to urgent care twice. Yesterday patient went to his PCP and had chest x-ray done which showed possible pneumonia and was referred to the ER. In the ER patient was found to be hypoxic and was started on nebulizer and antibiotics for possible pneumonia and asthma exacerbation. Patient also was placed on steroids. On my exam at this time patient is not actively wheezing. Patient states that over the last 1 week patient also notices that he gets increasing cough on lying down and on exam patient also has lower extremity edema. Patient states she has gained 15 pounds over the last few weeks. Denies any chest pain nausea vomiting abdominal pain or diarrhea.   Review of Systems: As presented in the history of presenting illness, rest negative.  Past Medical History  Diagnosis Date  . Diabetes mellitus   . Hypertension   . Arthritis   . Acid reflux   . High cholesterol    Past Surgical History  Procedure Laterality Date  . Prostate surgery  Sept. 5 2013  . Colonoscopy     Social History:  reports that he quit smoking about 31 years ago. His smoking use included Cigarettes. He has a 75 pack-year smoking history. He does not have any smokeless tobacco history on file. He reports that he does not drink alcohol or use illicit drugs. Where does patient live home. Can patient participate in ADLs? Yes.  Allergies  Allergen Reactions  . Sulfa  Antibiotics Other (See Comments)    unknown    Family History:  Family History  Problem Relation Age of Onset  . Diabetes Mellitus II Father   . CAD Sister       Prior to Admission medications   Medication Sig Start Date End Date Taking? Authorizing Provider  acetaminophen-codeine (TYLENOL #3) 300-30 MG per tablet Take 1-2 tablets by mouth every 6 (six) hours as needed (for pain). For pain 12/18/14   Paula LibraJohn Molpus, MD  acetaminophen-codeine (TYLENOL #3) 300-30 MG per tablet Take 2 tablets by mouth once. 12/18/14   Paula LibraJohn Molpus, MD  azelastine (ASTELIN) 137 MCG/SPRAY nasal spray Place 2 sprays into the nose daily. Use in each nostril as directed    Historical Provider, MD  ciprofloxacin (CIPRO) 500 MG tablet Take 500 mg by mouth 2 (two) times daily. For 10 days starting 07/20/12    Historical Provider, MD  clotrimazole-betamethasone (LOTRISONE) cream Apply 1 application topically 3 (three) times daily.    Historical Provider, MD  cyclobenzaprine (FLEXERIL) 10 MG tablet Take 5-10 mg by mouth 3 (three) times daily as needed. For back spasms    Historical Provider, MD  Docusate Calcium (STOOL SOFTENER PO) Take 1 capsule by mouth daily.    Historical Provider, MD  escitalopram (LEXAPRO) 10 MG tablet Take 10 mg by mouth daily.    Historical Provider, MD  Fiber TABS Take 1 tablet by mouth daily.    Historical Provider, MD  fluticasone (FLONASE) 50 MCG/ACT nasal spray Place 2 sprays into the nose daily.  Historical Provider, MD  levothyroxine (SYNTHROID, LEVOTHROID) 50 MCG tablet Take 50 mcg by mouth daily.    Historical Provider, MD  lisinopril-hydrochlorothiazide (PRINZIDE,ZESTORETIC) 10-12.5 MG per tablet Take 1 tablet by mouth daily.    Historical Provider, MD  loratadine (CLARITIN) 10 MG tablet Take 10 mg by mouth daily.    Historical Provider, MD  lovastatin (MEVACOR) 20 MG tablet Take 20 mg by mouth at bedtime.    Historical Provider, MD  metFORMIN (GLUCOPHAGE) 500 MG tablet Take 500 mg by mouth  daily.    Historical Provider, MD  omeprazole (PRILOSEC) 20 MG capsule Take 20 mg by mouth daily.    Historical Provider, MD  oxybutynin (DITROPAN) 5 MG tablet Take 5 mg by mouth 2 (two) times daily.    Historical Provider, MD  Pseudoephedrine-APAP-DM (COMTREX COLD & COUGH PO) Take 1 capsule by mouth every 8 (eight) hours as needed. For cough and cold    Historical Provider, MD  Psyllium (METAMUCIL PO) Take 15 g by mouth daily.    Historical Provider, MD  Tamsulosin HCl (FLOMAX) 0.4 MG CAPS Take 0.4 mg by mouth at bedtime.    Historical Provider, MD  zolpidem (AMBIEN) 10 MG tablet Take 10 mg by mouth at bedtime as needed. For sleep    Historical Provider, MD    Physical Exam: Filed Vitals:   03/16/15 1807 03/17/15 0052 03/17/15 0158 03/17/15 0343  BP:  149/88 140/83 129/78  Pulse:  106 96 91  Temp:  98.5 F (36.9 C) 98.6 F (37 C) 98.3 F (36.8 C)  TempSrc:  Oral Oral Oral  Resp:  Height:  (1.803 m)   (1.803 m)   Weight: 105.688 kg (233 lb)  87.7 kg (193 lb 5.5 oz)   SpO2:  95% 95% 93%     General:  Well-developed and nourished.  Eyes: Anicteric no pallor.  ENT: No discharge from the ears eyes nose and mouth.  Neck: JVD not appreciated. No mass felt.  Cardiovascular: S1 and S2 heard.  Respiratory: No rhonchi or crepitations.  Abdomen: Soft nontender bowel sounds present.  Skin: No rash.  Musculoskeletal: Bilateral lower extremity edema.  Psychiatric: Appears normal.  Neurologic: Alert awake oriented to time place and person. Moves all extremities.  Labs on Admission:  Basic Metabolic Panel:  Recent Labs Lab 03/16/15 1825  NA 130*  K 3.7  CL 97*  CO2 22  GLUCOSE 150*  BUN 27*  CREATININE 1.27*  CALCIUM 8.8*   Liver Function Tests: No results for input(s): AST, ALT, ALKPHOS, BILITOT, PROT, ALBUMIN in the last 168 hours. No results for input(s): LIPASE, AMYLASE in the last 168 hours. No results for input(s): AMMONIA in the last 168  hours. CBC:  Recent Labs Lab 03/16/15 1825  WBC 14.4*  NEUTROABS 10.8*  HGB 12.6*  HCT 36.5*  MCV 88.2  PLT 200   Cardiac Enzymes: No results for input(s): CKTOTAL, CKMB, CKMBINDEX, TROPONINI in the last 168 hours.  BNP (last 3 results) No results for input(s): BNP in the last 8760 hours.  ProBNP (last 3 results) No results for input(s): PROBNP in the last 8760 hours.  CBG:  Recent Labs Lab 03/16/15 1851  GLUCAP 128*    Radiological Exams on Admission: Dg Chest Port 1 View  03/17/2015   CLINICAL DATA:  Dyspnea and cough  EXAM: PORTABLE CHEST - 1 VIEW  COMPARISON:  None.  FINDINGS: There is a shallow inspiration. No confluent airspace consolidation is evident. There  is no large effusion. There is mild crowding of the basilar markings which is likely due to the shallow inspiration.  IMPRESSION: No acute cardiopulmonary findings.   Electronically Signed   By: Ellery Plunkaniel R Mitchell M.D.   On: 03/17/2015 03:34    EKG: Independently reviewed. Sinus tachycardia.  Assessment/Plan Principal Problem:   Acute respiratory failure with hypoxia Active Problems:   Asthma exacerbation   Productive cough   Hypothyroidism   Essential hypertension   Diabetes mellitus type 2, controlled   Normocytic anemia   Hyperlipidemia   Acute respiratory failure   1. Acute respiratory failure with hypoxia - cause not clear. On my exam patient is on wheezing. Chest x-ray done over ER does not show any definite infiltrates or congestion. At this time we will continue with nebulizer prednisone and antibiotics. I have ordered procalcitonin levels and if procalcitonin levels and negative may discontinue antibiotics. Since patient has lower extremity edema and has symptoms when lying down I have ordered 1 dose of Lasix 20 mg IV. Check BNP and 2-D echo for LV assessment. Closely follow intake and output daily weights. 2. Diabetes mellitus type 2 - continue home medications with sliding scale  coverage. 3. Hypertension - continue home medications. 4. Hypothyroidism - check TSH. Continue Synthroid. 5. Renal failure not sure if it is acute or chronic baseline creatinine not available closely follow intake and output and metabolic panel. 6. Normocytic anemia - patient states he has had colonoscopy 2 years ago and is scheduled for one next year. Follow CBC closely. 7. Hyperlipidemia continue home medications.   DVT Prophylaxis Lovenox.  Code Status: Full code.  Family Communication: Discussed with patient.  Disposition Plan: Admit to inpatient. Likely stay 2-3 days.    Leaha Cuervo N. [facility] Pager [phone].  If 7PM-7AM, please contact night-coverage [email or web address] Password [email or web address] 03/17/2015, 4:17 AM

## 2015-03-17 NOTE — Progress Notes (Signed)
Dc'd via [employer],started to give discharge instructions, refused discharge instructions. States, I know all those medication and I will call Dr. Deborha PaymentSam Kelly's office to make appointment.  No complaints.

## 2015-03-17 NOTE — Progress Notes (Addendum)
Has medications from home and refuse to let nurse take to pharmacy.  States, I used some of my medication early this am. One taken was nasal spray Ipratropium bromide and synthroid.  Explained to patient he does  not need to take medications due to possible overdose.  Refused to listen and states he took Robitussin with codeine.  MD notified and aware.  Continue to watch. 

## 2015-03-17 NOTE — Progress Notes (Addendum)
Patient claims to be much better-less sob and cough. Lungs clear on exam. Insisting on discharge, he knows he probably should stay another 24 hours-but adamantly refused. Risks of worsening PNA, sepsis, resp failure, cardiac arrest and other risks causing death or significant disability explained in detail. He accepts all risks and will be discharged home at his home request.Have asked him to follow up with PCP within one week, have asked him to seek immediate medical attention if SOB worsens, fever, chest pain. He understands.Note-he is completely awake and alert, oriented X4

## 2015-03-17 NOTE — Discharge Summary (Signed)
PATIENT DETAILS Name: Richard Collier Age: 74 y.o. Sex: male Date of Birth: 08-Nov-1941 MRN: [id number]. Admitting Physician: Yevonne PaxSaadat A Khan, MD NFA:OZHYQ,MVHPCP:KELLY,SAM, MD  Admit Date: 03/16/2015 Discharge date: 03/17/2015  Recommendations for Outpatient Follow-up:  1. Repeat chest x-ray in 6-8 weeks to document resolution of pneumonia 2. Repeat CBC and chemistries in 1-2 weeks or at next follow-up with PCP.  PRIMARY DISCHARGE DIAGNOSIS:  Principal Problem:   Acute respiratory failure with hypoxia Active Problems:   Asthma exacerbation   Productive cough   Hypothyroidism   Essential hypertension   Diabetes mellitus type 2, controlled   Normocytic anemia   Hyperlipidemia   Acute respiratory failure      PAST MEDICAL HISTORY: Past Medical History  Diagnosis Date  . Diabetes mellitus   . Hypertension   . Arthritis   . Acid reflux   . High cholesterol     DISCHARGE MEDICATIONS: Current Discharge Medication List    START taking these medications   Details  albuterol (PROVENTIL HFA;VENTOLIN HFA) 108 (90 BASE) MCG/ACT inhaler Inhale 2 puffs into the lungs every 6 (six) hours as needed for wheezing or shortness of breath. Qty: 1 Inhaler, Refills: 0    albuterol (PROVENTIL) (2.5 MG/3ML) 0.083% nebulizer solution Take 3 mLs (2.5 mg total) by nebulization every 2 (two) hours as needed for wheezing. Qty: 75 mL, Refills: 0    Fluticasone-Salmeterol (ADVAIR DISKUS) 250-50 MCG/DOSE AEPB Inhale 1 puff into the lungs 2 (two) times daily. Qty: 60 each, Refills: 0    guaiFENesin-codeine (ROBITUSSIN AC) 100-10 MG/5ML syrup Take 5 mLs by mouth 3 (three) times daily as needed for cough. Qty: 118 mL, Refills: 0    levofloxacin (LEVAQUIN) 750 MG tablet Take 1 tablet (750 mg total) by mouth daily. Qty: 5 tablet, Refills: 0    predniSONE (DELTASONE) 10 MG tablet Take 4 tablets (40 mg) daily for 2 days, then, Take 3 tablets (30 mg) daily for 2 days, then, Take 2 tablets (20 mg) daily for 2 days,  then, Take 1 tablets (10 mg) daily for 1 days, then stop Qty: 19 tablet, Refills: 0      CONTINUE these medications which have NOT CHANGED   Details  azelastine (ASTELIN) 137 MCG/SPRAY nasal spray Place 2 sprays into the nose daily. Use in each nostril as directed    clotrimazole-betamethasone (LOTRISONE) cream Apply 1 application topically 3 (three) times daily.    cyclobenzaprine (FLEXERIL) 10 MG tablet Take 5-10 mg by mouth 3 (three) times daily as needed. For back spasms    Docusate Calcium (STOOL SOFTENER PO) Take 1 capsule by mouth daily.    escitalopram (LEXAPRO) 10 MG tablet Take 10 mg by mouth daily.    Fiber TABS Take 1 tablet by mouth daily.    fluticasone (FLONASE) 50 MCG/ACT nasal spray Place 2 sprays into the nose daily.    levothyroxine (SYNTHROID, LEVOTHROID) 50 MCG tablet Take 50 mcg by mouth daily.    lisinopril-hydrochlorothiazide (PRINZIDE,ZESTORETIC) 10-12.5 MG per tablet Take 1 tablet by mouth daily.    loratadine (CLARITIN) 10 MG tablet Take 10 mg by mouth daily.    lovastatin (MEVACOR) 20 MG tablet Take 20 mg by mouth at bedtime.    metFORMIN (GLUCOPHAGE) 500 MG tablet Take 500 mg by mouth daily.    omeprazole (PRILOSEC) 20 MG capsule Take 20 mg by mouth daily.    oxybutynin (DITROPAN) 5 MG tablet Take 5 mg by mouth 2 (two) times daily.    Pseudoephedrine-APAP-DM (COMTREX COLD & COUGH  PO) Take 1 capsule by mouth every 8 (eight) hours as needed. For cough and cold    Psyllium (METAMUCIL PO) Take 15 g by mouth daily.    Tamsulosin HCl (FLOMAX) 0.4 MG CAPS Take 0.4 mg by mouth at bedtime.    zolpidem (AMBIEN) 10 MG tablet Take 10 mg by mouth at bedtime as needed. For sleep      STOP taking these medications     acetaminophen-codeine (TYLENOL #3) 300-30 MG per tablet      acetaminophen-codeine (TYLENOL #3) 300-30 MG per tablet      ciprofloxacin (CIPRO) 500 MG tablet         ALLERGIES:   Allergies  Allergen Reactions  . Sulfa Antibiotics  Other (See Comments)    unknown    BRIEF HPI:  See H&P, Labs, Consult and Test reports for all details in brief, patient is a 74 y.o. male with history of diabetes mellitus type 2, hypertension, hyperlipidemia, hypothyroidism who presented with persistent cough and shortness of breath. He was subsequently admitted for further evaluation and treatment  CONSULTATIONS:   None  PERTINENT RADIOLOGIC STUDIES: Dg Chest 2 View  03/17/2015   CLINICAL DATA:  Shortness of breath and cough, history of asthma and  EXAM: CHEST  2 VIEW  COMPARISON:  Portable chest x-ray of Mar 17, 2015 and PA and lateral chest of October 30, 2013.  FINDINGS: The lungs are well-expanded. The interstitial markings are coarse especially in the mid and lower lung. There is subsegmental atelectasis at both lung bases posteriorly. The heart and pulmonary vascularity are normal. The mediastinum is normal in width. There is no pleural effusion. The bony thorax is unremarkable.  IMPRESSION: COPD-reactive airway disease. Pulmonary interstitial edema or infiltrate at the lung bases has improved slightly. There is persistent subsegmental atelectasis.   Electronically Signed   By: David  [place] M.D.   On: 03/17/2015 07:57   Dg Chest Port 1 View  03/17/2015   CLINICAL DATA:  Dyspnea and cough  EXAM: PORTABLE CHEST - 1 VIEW  COMPARISON:  None.  FINDINGS: There is a shallow inspiration. No confluent airspace consolidation is evident. There is no large effusion. There is mild crowding of the basilar markings which is likely due to the shallow inspiration.  IMPRESSION: No acute cardiopulmonary findings.   Electronically Signed   By: Ellery Plunk M.D.   On: 03/17/2015 03:34     PERTINENT LAB RESULTS: CBC:  Recent Labs  03/16/15 1825 03/17/15 0630  WBC 14.4* 16.4*  HGB 12.6* 11.9*  HCT 36.5* 34.4*  PLT 200 209   CMET CMP     Component Value Date/Time   NA 130* 03/16/2015 1825   K 3.7 03/16/2015 1825   CL 97* 03/16/2015 1825    CO2 22 03/16/2015 1825   GLUCOSE 150* 03/16/2015 1825   BUN 27* 03/16/2015 1825   CREATININE 1.23 03/17/2015 0630   CALCIUM 8.8* 03/16/2015 1825   GFRNONAA 57* 03/17/2015 0630   GFRAA >60 03/17/2015 0630    GFR Estimated Creatinine Clearance: 58.7 mL/min (by C-G formula based on Cr of 1.23). No results for input(s): LIPASE, AMYLASE in the last 72 hours. No results for input(s): CKTOTAL, CKMB, CKMBINDEX, TROPONINI in the last 72 hours. Invalid input(s): POCBNP No results for input(s): DDIMER in the last 72 hours. No results for input(s): HGBA1C in the last 72 hours. No results for input(s): CHOL, HDL, LDLCALC, TRIG, CHOLHDL, LDLDIRECT in the last 72 hours. No results for input(s): TSH, T4TOTAL, T3FREE,  THYROIDAB in the last 72 hours.  Invalid input(s): FREET3 No results for input(s): VITAMINB12, FOLATE, FERRITIN, TIBC, IRON, RETICCTPCT in the last 72 hours. Coags: No results for input(s): INR in the last 72 hours.  Invalid input(s): PT Microbiology: Recent Results (from the past 240 hour(s))  MRSA PCR Screening     Status: None   Collection Time: 03/17/15  3:06 AM  Result Value Ref Range Status   MRSA by PCR NEGATIVE NEGATIVE Final    Comment:        The GeneXpert MRSA Assay (FDA approved for NASAL specimens only), is one component of a comprehensive MRSA colonization surveillance program. It is not intended to diagnose MRSA infection nor to guide or monitor treatment for MRSA infections.      BRIEF HOSPITAL COURSE:   Principal Problem:   Acute respiratory failure with hypoxia: Suspect this is secondary to acute asthmatic bronchitis. Per patient, he takes albuterol/Advair at home, suspect he has a history of reactive airway disease-likely asthma. He was admitted and started on steroids, antibiotics, nebulized bronchodilators. He felt significantly better this morning, and was insisting on discharge-patient is being discharged home at his own request. Results of  significant worsening causing disability and that has been discussed with the patient, he is understands and is accepting of these risks Please see above regarding discharge medication.  Active Problems:   Suspected acute asthmatic bronchitis: Treated with steroids, antibiotics and nebulized bronchodilators. Significantly improved, cough is remarkably better. On exam, there is good air entry, still has some scattered rhonchi. I did recommend to the patient that he stay another day to make sure he is optimized and stable for discharge, however he was insisting on going home. He is being discharged home at his own request. He has been instructed to seek immediate medical attention if he has fever, worsening shortness of breath, chest pain.  Type 2 diabetes: Continue with metformin on discharge. CBGs were stable during this hospital stay.  Essential hypertension: Blood pressure was controlled-continue with usual antihypertensive regimen on discharge  Hypothyroidism: Continue with levothyroxine  Normocytic anemia: Follow CBC closely as an outpatient. Per patient he had a colonoscopy 2 years back and is scheduled for another one next year.  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  TODAY-DAY OF DISCHARGE:  Subjective:   Richard Collier today has no headache,no chest abdominal pain,no new weakness tingling or numbness, feels much better wants to go home today.   Objective:   Blood pressure 148/77, pulse 84, temperature 98.5 F (36.9 C), temperature source Oral, resp. rate 16, height 5\' 11"  (1.803 m), weight 87.7 kg (193 lb 5.5 oz), SpO2 95 %.  Intake/Output Summary (Last 24 hours) at 03/17/15 0948 Last data filed at 03/17/15 0900  Gross per 24 hour  Intake    800 ml  Output   1400 ml  Net   -600 ml   Filed Weights   03/16/15 1807 03/17/15 0158  Weight: 105.688 kg (233 lb) 87.7 kg (193 lb 5.5 oz)    Exam Awake Alert, Oriented *3, No new F.N  deficits, Normal affect [place].AT,PERRAL Supple Neck,No JVD, No cervical lymphadenopathy appriciated.  Symmetrical Chest wall movement, Good air movement bilaterally, CTAB-with scattered rhonchi RRR,No Gallops,Rubs or new Murmurs, No Parasternal Heave +ve B.Sounds, Abd Soft, Non tender, No organomegaly appriciated, No rebound -guarding or rigidity. No Cyanosis, Clubbing or edema, No new Rash or bruise  DISCHARGE CONDITION: Stable  DISPOSITION: Home  DISCHARGE INSTRUCTIONS:    Activity:  As tolerated  Diet recommendation: Diabetic Diet Heart Healthy diet  Discharge Instructions    Call MD for:  difficulty breathing, headache or visual disturbances    Complete by:  As directed      Call MD for:  temperature >100.4    Complete by:  As directed      Diet - low sodium heart healthy    Complete by:  As directed      Diet Carb Modified    Complete by:  As directed      Increase activity slowly    Complete by:  As directed           Follow-up Information    Follow up with [facility], MD. Schedule an appointment as soon as possible for a visit in 1 week.   Specialty:  Family Medicine   Contact information:   [address] [facility] [address] [phone]      Total Time spent on discharge equals  45 minutes.  SignedJeoffrey Massed 03/17/2015 9:48 AM

## 2015-03-17 NOTE — Progress Notes (Signed)
Patient asking for more Robitussin but not time. Reached in bag and took more of his personal Robitussin.  Explained it  was not time for Robitussin but states, I can take it every 4-6 hours.  To be discharged.  Awaiting case management to get patient's nebulizer for home use.  Continue to watch.

## 2015-03-17 NOTE — Progress Notes (Signed)
Eating lunch, taxi called by Child psychotherapistocial Worker, patient refused to take Taxi at this time. States, "I will be ready in 30 minutes. Taxi waiting outside, Social worker notified taxi patient not ready.  Will call taxi when ready.

## 2015-03-17 NOTE — Progress Notes (Signed)
Has medications from home and refuse to let nurse take to pharmacy.  States, I used some of my medication early this am. One taken was nasal spray Ipratropium bromide and synthroid.  Explained to patient he does  not need to take medications due to possible overdose.  Refused to listen and states he took Robitussin with codeine.  MD notified and aware.  Continue to watch.

## 2015-03-18 LAB — LEGIONELLA ANTIGEN, URINE

## 2015-03-19 ENCOUNTER — Emergency Department (HOSPITAL_BASED_OUTPATIENT_CLINIC_OR_DEPARTMENT_OTHER): Payer: Medicare Other

## 2015-03-19 ENCOUNTER — Encounter (HOSPITAL_BASED_OUTPATIENT_CLINIC_OR_DEPARTMENT_OTHER): Payer: Self-pay

## 2015-03-19 ENCOUNTER — Inpatient Hospital Stay (HOSPITAL_COMMUNITY): Payer: Medicare Other

## 2015-03-19 ENCOUNTER — Other Ambulatory Visit (HOSPITAL_COMMUNITY): Payer: Medicare Other

## 2015-03-19 ENCOUNTER — Inpatient Hospital Stay (HOSPITAL_BASED_OUTPATIENT_CLINIC_OR_DEPARTMENT_OTHER)
Admission: EM | Admit: 2015-03-19 | Discharge: 2015-03-24 | DRG: 640 | Disposition: A | Discharge status: Home / Self Care | Payer: Medicare Other | Attending: Internal Medicine | Admitting: Internal Medicine

## 2015-03-19 ENCOUNTER — Emergency Department (HOSPITAL_BASED_OUTPATIENT_CLINIC_OR_DEPARTMENT_OTHER)
Admission: EM | Admit: 2015-03-19 | Discharge: 2015-03-19 | Discharge status: Absent without leave | Payer: Medicare Other

## 2015-03-19 DIAGNOSIS — Z882 Allergy status to sulfonamides status: Secondary | ICD-10-CM

## 2015-03-19 DIAGNOSIS — E038 Other specified hypothyroidism: Secondary | ICD-10-CM | POA: Diagnosis present

## 2015-03-19 DIAGNOSIS — R7989 Other specified abnormal findings of blood chemistry: Secondary | ICD-10-CM

## 2015-03-19 DIAGNOSIS — N179 Acute kidney failure, unspecified: Secondary | ICD-10-CM | POA: Diagnosis present

## 2015-03-19 DIAGNOSIS — I129 Hypertensive chronic kidney disease with stage 1 through stage 4 chronic kidney disease, or unspecified chronic kidney disease: Secondary | ICD-10-CM | POA: Diagnosis present

## 2015-03-19 DIAGNOSIS — R0789 Other chest pain: Secondary | ICD-10-CM

## 2015-03-19 DIAGNOSIS — R0602 Shortness of breath: Secondary | ICD-10-CM | POA: Diagnosis present

## 2015-03-19 DIAGNOSIS — N1330 Unspecified hydronephrosis: Secondary | ICD-10-CM | POA: Diagnosis present

## 2015-03-19 DIAGNOSIS — K219 Gastro-esophageal reflux disease without esophagitis: Secondary | ICD-10-CM | POA: Diagnosis present

## 2015-03-19 DIAGNOSIS — E119 Type 2 diabetes mellitus without complications: Secondary | ICD-10-CM | POA: Diagnosis present

## 2015-03-19 DIAGNOSIS — E871 Hypo-osmolality and hyponatremia: Principal | ICD-10-CM | POA: Diagnosis present

## 2015-03-19 DIAGNOSIS — R0600 Dyspnea, unspecified: Secondary | ICD-10-CM

## 2015-03-19 DIAGNOSIS — R410 Disorientation, unspecified: Secondary | ICD-10-CM | POA: Diagnosis present

## 2015-03-19 DIAGNOSIS — I9589 Other hypotension: Secondary | ICD-10-CM | POA: Diagnosis present

## 2015-03-19 DIAGNOSIS — Z79899 Other long term (current) drug therapy: Secondary | ICD-10-CM | POA: Diagnosis not present

## 2015-03-19 DIAGNOSIS — J9601 Acute respiratory failure with hypoxia: Secondary | ICD-10-CM | POA: Diagnosis not present

## 2015-03-19 DIAGNOSIS — J9621 Acute and chronic respiratory failure with hypoxia: Secondary | ICD-10-CM | POA: Diagnosis present

## 2015-03-19 DIAGNOSIS — E785 Hyperlipidemia, unspecified: Secondary | ICD-10-CM | POA: Diagnosis present

## 2015-03-19 DIAGNOSIS — J441 Chronic obstructive pulmonary disease with (acute) exacerbation: Secondary | ICD-10-CM | POA: Diagnosis present

## 2015-03-19 DIAGNOSIS — R109 Unspecified abdominal pain: Secondary | ICD-10-CM | POA: Diagnosis present

## 2015-03-19 DIAGNOSIS — N189 Chronic kidney disease, unspecified: Secondary | ICD-10-CM | POA: Diagnosis present

## 2015-03-19 DIAGNOSIS — R0902 Hypoxemia: Secondary | ICD-10-CM

## 2015-03-19 DIAGNOSIS — R1084 Generalized abdominal pain: Secondary | ICD-10-CM | POA: Diagnosis present

## 2015-03-19 DIAGNOSIS — D72829 Elevated white blood cell count, unspecified: Secondary | ICD-10-CM | POA: Diagnosis present

## 2015-03-19 DIAGNOSIS — E039 Hypothyroidism, unspecified: Secondary | ICD-10-CM | POA: Diagnosis present

## 2015-03-19 DIAGNOSIS — I959 Hypotension, unspecified: Secondary | ICD-10-CM | POA: Diagnosis not present

## 2015-03-19 DIAGNOSIS — Z87891 Personal history of nicotine dependence: Secondary | ICD-10-CM

## 2015-03-19 DIAGNOSIS — M1990 Unspecified osteoarthritis, unspecified site: Secondary | ICD-10-CM | POA: Diagnosis present

## 2015-03-19 DIAGNOSIS — Z833 Family history of diabetes mellitus: Secondary | ICD-10-CM | POA: Diagnosis not present

## 2015-03-19 DIAGNOSIS — M7989 Other specified soft tissue disorders: Secondary | ICD-10-CM | POA: Diagnosis not present

## 2015-03-19 LAB — COMPREHENSIVE METABOLIC PANEL
ALT: 31 U/L (ref 17–63)
ALT: 32 U/L (ref 17–63)
AST: 45 U/L — AB (ref 15–41)
AST: 45 U/L — ABNORMAL HIGH (ref 15–41)
Albumin: 3.1 g/dL — ABNORMAL LOW (ref 3.5–5.0)
Albumin: 3.2 g/dL — ABNORMAL LOW (ref 3.5–5.0)
Alkaline Phosphatase: 65 U/L (ref 38–126)
Alkaline Phosphatase: 61 U/L (ref 38–126)
Anion gap: 9 (ref 5–15)
Anion gap: 12 (ref 5–15)
BILIRUBIN TOTAL: 0.5 mg/dL (ref 0.3–1.2)
BUN: 28 mg/dL — ABNORMAL HIGH (ref 6–20)
BUN: 28 mg/dL — ABNORMAL HIGH (ref 6–20)
CALCIUM: 8.3 mg/dL — AB (ref 8.9–10.3)
CO2: 22 mmol/L (ref 22–32)
CO2: 23 mmol/L (ref 22–32)
CREATININE: 1.41 mg/dL — AB (ref 0.61–1.24)
Calcium: 8.5 mg/dL — ABNORMAL LOW (ref 8.9–10.3)
Chloride: 84 mmol/L — ABNORMAL LOW (ref 101–111)
Chloride: 79 mmol/L — ABNORMAL LOW (ref 101–111)
Creatinine, Ser: 1.4 mg/dL — ABNORMAL HIGH (ref 0.61–1.24)
GFR calc Af Amer: 57 mL/min — ABNORMAL LOW (ref >60)
GFR calc non Af Amer: 49 mL/min — ABNORMAL LOW (ref >60)
GFR, EST AFRICAN AMERICAN: 56 mL/min — AB (ref >60)
GFR, EST NON AFRICAN AMERICAN: 49 mL/min — AB (ref >60)
GLUCOSE: 156 mg/dL — AB (ref 70–99)
GLUCOSE: 115 mg/dL — AB (ref 70–99)
POTASSIUM: 4.4 mmol/L (ref 3.5–5.1)
Potassium: 4.5 mmol/L (ref 3.5–5.1)
SODIUM: 115 mmol/L — AB (ref 135–145)
Sodium: 114 mmol/L — CL (ref 135–145)
TOTAL PROTEIN: 6.2 g/dL — AB (ref 6.5–8.1)
Total Bilirubin: 0.5 mg/dL (ref 0.3–1.2)
Total Protein: 6.3 g/dL — ABNORMAL LOW (ref 6.5–8.1)

## 2015-03-19 LAB — BASIC METABOLIC PANEL
Anion gap: 13 (ref 5–15)
BUN: 36 mg/dL — AB (ref 6–20)
CALCIUM: 8.9 mg/dL (ref 8.9–10.3)
CO2: 20 mmol/L — AB (ref 22–32)
CREATININE: 1.36 mg/dL — AB (ref 0.61–1.24)
Chloride: 86 mmol/L — ABNORMAL LOW (ref 101–111)
GFR calc Af Amer: 59 mL/min — ABNORMAL LOW (ref >60)
GFR calc non Af Amer: 51 mL/min — ABNORMAL LOW (ref >60)
Glucose, Bld: 158 mg/dL — ABNORMAL HIGH (ref 70–99)
Potassium: 4.3 mmol/L (ref 3.5–5.1)
Sodium: 119 mmol/L — CL (ref 135–145)

## 2015-03-19 LAB — CBC WITH DIFFERENTIAL/PLATELET
BASOS ABS: 0 10*3/uL (ref 0.0–0.1)
BASOS PCT: 0 % (ref 0–1)
Band Neutrophils: 4 % (ref 0–10)
EOS ABS: 0 10*3/uL (ref 0.0–0.7)
Eosinophils Relative: 0 % (ref 0–5)
HEMATOCRIT: 32.3 % — AB (ref 39.0–52.0)
HEMOGLOBIN: 11.6 g/dL — AB (ref 13.0–17.0)
Lymphocytes Relative: 12 % (ref 12–46)
Lymphs Abs: 3.1 10*3/uL (ref 0.7–4.0)
MCH: 30.4 pg (ref 26.0–34.0)
MCHC: 35.9 g/dL (ref 30.0–36.0)
MCV: 84.8 fL (ref 78.0–100.0)
MONO ABS: 1.8 10*3/uL — AB (ref 0.1–1.0)
MONOS PCT: 7 % (ref 3–12)
Metamyelocytes Relative: 2 %
Myelocytes: 5 %
Neutro Abs: 20.8 10*3/uL — ABNORMAL HIGH (ref 1.7–7.7)
Neutrophils Relative %: 70 % (ref 43–77)
Platelets: 318 10*3/uL (ref 150–400)
RBC: 3.81 MIL/uL — ABNORMAL LOW (ref 4.22–5.81)
RDW: 13.3 % (ref 11.5–15.5)
WBC: 25.7 10*3/uL — AB (ref 4.0–10.5)

## 2015-03-19 LAB — BLOOD GAS, ARTERIAL
Acid-base deficit: 2 mmol/L (ref 0.0–2.0)
Bicarbonate: 21.2 mEq/L (ref 20.0–24.0)
DRAWN BY: 43707
O2 Content: 2 L/min
O2 SAT: 93.2 %
PO2 ART: 62.4 mmHg — AB (ref 80.0–100.0)
Patient temperature: 98.6
TCO2: 22.1 mmol/L (ref 0–100)
pCO2 arterial: 29.8 mmHg — ABNORMAL LOW (ref 35.0–45.0)
pH, Arterial: 7.467 — ABNORMAL HIGH (ref 7.350–7.450)

## 2015-03-19 LAB — CBC
HCT: 31.1 % — ABNORMAL LOW (ref 39.0–52.0)
Hemoglobin: 10.9 g/dL — ABNORMAL LOW (ref 13.0–17.0)
MCH: 29.5 pg (ref 26.0–34.0)
MCHC: 35 g/dL (ref 30.0–36.0)
MCV: 84.1 fL (ref 78.0–100.0)
Platelets: 272 10*3/uL (ref 150–400)
RBC: 3.7 MIL/uL — ABNORMAL LOW (ref 4.22–5.81)
RDW: 13.8 % (ref 11.5–15.5)
WBC: 19.4 10*3/uL — ABNORMAL HIGH (ref 4.0–10.5)

## 2015-03-19 LAB — RAPID URINE DRUG SCREEN, HOSP PERFORMED
Amphetamines: NOT DETECTED
BARBITURATES: NOT DETECTED
Benzodiazepines: NOT DETECTED
Cocaine: NOT DETECTED
Opiates: POSITIVE — AB
Tetrahydrocannabinol: NOT DETECTED

## 2015-03-19 LAB — URINALYSIS, ROUTINE W REFLEX MICROSCOPIC
Bilirubin Urine: NEGATIVE
Glucose, UA: NEGATIVE mg/dL
Hgb urine dipstick: NEGATIVE
KETONES UR: NEGATIVE mg/dL
Leukocytes, UA: NEGATIVE
NITRITE: NEGATIVE
PH: 5.5 (ref 5.0–8.0)
Protein, ur: 100 mg/dL — AB
Specific Gravity, Urine: 1.018 (ref 1.005–1.030)
UROBILINOGEN UA: 0.2 mg/dL (ref 0.0–1.0)

## 2015-03-19 LAB — ETHANOL: Alcohol, Ethyl (B): <5 mg/dL (ref <5)

## 2015-03-19 LAB — URINE MICROSCOPIC-ADD ON

## 2015-03-19 LAB — BRAIN NATRIURETIC PEPTIDE: B Natriuretic Peptide: 32.9 pg/mL (ref 0.0–100.0)

## 2015-03-19 LAB — SODIUM, URINE, RANDOM: SODIUM UR: 49 mmol/L

## 2015-03-19 LAB — GLUCOSE, CAPILLARY
Glucose-Capillary: 156 mg/dL — ABNORMAL HIGH (ref 70–99)
Glucose-Capillary: 115 mg/dL — ABNORMAL HIGH (ref 70–99)

## 2015-03-19 LAB — URIC ACID: Uric Acid, Serum: 5.3 mg/dL (ref 4.4–7.6)

## 2015-03-19 LAB — TSH: TSH: 0.998 u[IU]/mL (ref 0.350–4.500)

## 2015-03-19 LAB — CREATININE, URINE, RANDOM: CREATININE, URINE: 34.19 mg/dL

## 2015-03-19 LAB — TROPONIN I

## 2015-03-19 LAB — D-DIMER, QUANTITATIVE: D-Dimer, Quant: 1.12 ug/mL-FEU — ABNORMAL HIGH (ref 0.00–0.48)

## 2015-03-19 MED ORDER — ESCITALOPRAM OXALATE 10 MG PO TABS
10.0000 mg | ORAL_TABLET | Freq: Every day | ORAL | Status: DC
  Administered 2015-03-19 – 2015-03-22 (×4): 10 mg via ORAL
  Filled 2015-03-19 (×4): qty 1

## 2015-03-19 MED ORDER — LISINOPRIL 10 MG PO TABS
10.0000 mg | ORAL_TABLET | Freq: Every day | ORAL | Status: DC
  Administered 2015-03-20 – 2015-03-22 (×3): 10 mg via ORAL
  Filled 2015-03-19 (×3): qty 1

## 2015-03-19 MED ORDER — METHYLPREDNISOLONE SODIUM SUCC 125 MG IJ SOLR
125.0000 mg | Freq: Once | INTRAMUSCULAR | Status: AC
  Administered 2015-03-19: 125 mg via INTRAVENOUS
  Filled 2015-03-19: qty 2

## 2015-03-19 MED ORDER — SODIUM CHLORIDE 0.9 % IJ SOLN
3.0000 mL | Freq: Two times a day (BID) | INTRAMUSCULAR | Status: DC
  Administered 2015-03-20 – 2015-03-24 (×8): 3 mL via INTRAVENOUS

## 2015-03-19 MED ORDER — OXYBUTYNIN CHLORIDE 5 MG PO TABS: 5.0000 mg | ORAL_TABLET | Freq: Two times a day (BID) | ORAL | Status: DC

## 2015-03-19 MED ORDER — LISINOPRIL-HYDROCHLOROTHIAZIDE 10-12.5 MG PO TABS: 1.0000 | ORAL_TABLET | Freq: Every day | ORAL | Status: DC

## 2015-03-19 MED ORDER — SODIUM CHLORIDE 0.9 % IJ SOLN
3.0000 mL | Freq: Two times a day (BID) | INTRAMUSCULAR | Status: DC
  Administered 2015-03-19: 3 mL via INTRAVENOUS

## 2015-03-19 MED ORDER — LEVOTHYROXINE SODIUM 50 MCG PO TABS
50.0000 ug | ORAL_TABLET | Freq: Every day | ORAL | Status: DC
  Administered 2015-03-20 – 2015-03-24 (×5): 50 ug via ORAL
  Filled 2015-03-19 (×6): qty 1

## 2015-03-19 MED ORDER — FENTANYL CITRATE (PF) 100 MCG/2ML IJ SOLN
50.0000 ug | Freq: Once | INTRAMUSCULAR | Status: AC
  Administered 2015-03-19: 50 ug via INTRAVENOUS
  Filled 2015-03-19: qty 2

## 2015-03-19 MED ORDER — HYDROCHLOROTHIAZIDE 12.5 MG PO CAPS: 12.5000 mg | ORAL_CAPSULE | Freq: Every day | ORAL | Status: DC

## 2015-03-19 MED ORDER — ALBUTEROL SULFATE (2.5 MG/3ML) 0.083% IN NEBU
2.5000 mg | INHALATION_SOLUTION | RESPIRATORY_TRACT | Status: DC | PRN
Start: 2015-03-19 — End: 2015-03-20

## 2015-03-19 MED ORDER — IPRATROPIUM-ALBUTEROL 0.5-2.5 (3) MG/3ML IN SOLN
3.0000 mL | RESPIRATORY_TRACT | Status: AC
  Administered 2015-03-19 (×2): 3 mL via RESPIRATORY_TRACT
  Filled 2015-03-19 (×2): qty 3

## 2015-03-19 MED ORDER — ONDANSETRON HCL 4 MG/2ML IJ SOLN
4.0000 mg | Freq: Four times a day (QID) | INTRAMUSCULAR | Status: DC | PRN
  Administered 2015-03-20 – 2015-03-21 (×2): 4 mg via INTRAVENOUS
  Filled 2015-03-19 (×2): qty 2

## 2015-03-19 MED ORDER — SODIUM CHLORIDE 0.9 % IV SOLN
INTRAVENOUS | Status: DC
  Administered 2015-03-19: 10:00:00 via INTRAVENOUS

## 2015-03-19 MED ORDER — IOHEXOL 300 MG/ML  SOLN
25.0000 mL | INTRAMUSCULAR | Status: AC
  Administered 2015-03-19 (×2): 25 mL via ORAL

## 2015-03-19 MED ORDER — PRAVASTATIN SODIUM 20 MG PO TABS
20.0000 mg | ORAL_TABLET | Freq: Every day | ORAL | Status: DC
  Administered 2015-03-19 – 2015-03-23 (×5): 20 mg via ORAL
  Filled 2015-03-19 (×7): qty 1

## 2015-03-19 MED ORDER — SODIUM CHLORIDE 0.9 % IV SOLN: 250.0000 mL | INTRAVENOUS | Status: DC | PRN

## 2015-03-19 MED ORDER — FENTANYL CITRATE (PF) 100 MCG/2ML IJ SOLN
50.0000 ug | INTRAMUSCULAR | Status: AC | PRN
  Administered 2015-03-19 (×2): 50 ug via INTRAVENOUS
  Filled 2015-03-19 (×2): qty 2

## 2015-03-19 MED ORDER — MOMETASONE FURO-FORMOTEROL FUM 100-5 MCG/ACT IN AERO
2.0000 | INHALATION_SPRAY | Freq: Two times a day (BID) | RESPIRATORY_TRACT | Status: DC
  Administered 2015-03-19 – 2015-03-24 (×10): 2 via RESPIRATORY_TRACT
  Filled 2015-03-19: qty 8.8

## 2015-03-19 MED ORDER — HEPARIN SODIUM (PORCINE) 5000 UNIT/ML IJ SOLN
5000.0000 [IU] | Freq: Three times a day (TID) | INTRAMUSCULAR | Status: DC
  Administered 2015-03-19 – 2015-03-24 (×14): 5000 [IU] via SUBCUTANEOUS
  Filled 2015-03-19 (×18): qty 1

## 2015-03-19 MED ORDER — SODIUM CHLORIDE 0.9 % IJ SOLN: 3.0000 mL | INTRAMUSCULAR | Status: DC | PRN

## 2015-03-19 MED ORDER — PANTOPRAZOLE SODIUM 40 MG PO TBEC
80.0000 mg | DELAYED_RELEASE_TABLET | Freq: Every day | ORAL | Status: DC
  Administered 2015-03-19 – 2015-03-24 (×6): 80 mg via ORAL
  Filled 2015-03-19 (×6): qty 2

## 2015-03-19 MED ORDER — METHYLPREDNISOLONE SODIUM SUCC 125 MG IJ SOLR
125.0000 mg | INTRAMUSCULAR | Status: DC
  Administered 2015-03-20: 125 mg via INTRAVENOUS
  Filled 2015-03-19: qty 2

## 2015-03-19 MED ORDER — INSULIN ASPART 100 UNIT/ML ~~LOC~~ SOLN
0.0000 [IU] | SUBCUTANEOUS | Status: DC
  Administered 2015-03-19 – 2015-03-20 (×3): 2 [IU] via SUBCUTANEOUS
  Administered 2015-03-20 (×2): 1 [IU] via SUBCUTANEOUS
  Administered 2015-03-21 – 2015-03-22 (×4): 2 [IU] via SUBCUTANEOUS
  Administered 2015-03-22: 1 [IU] via SUBCUTANEOUS

## 2015-03-19 MED ORDER — FUROSEMIDE 10 MG/ML IJ SOLN
20.0000 mg | Freq: Two times a day (BID) | INTRAMUSCULAR | Status: DC
  Administered 2015-03-19: 20 mg via INTRAVENOUS
  Filled 2015-03-19: qty 2

## 2015-03-19 MED ORDER — LEVOFLOXACIN IN D5W 750 MG/150ML IV SOLN
750.0000 mg | INTRAVENOUS | Status: DC
  Administered 2015-03-19 – 2015-03-20 (×2): 750 mg via INTRAVENOUS
  Filled 2015-03-19 (×3): qty 150

## 2015-03-19 MED ORDER — SODIUM CHLORIDE 0.9 % IV SOLN
Freq: Once | INTRAVENOUS | Status: AC
  Administered 2015-03-19: 09:00:00 via INTRAVENOUS

## 2015-03-19 MED ORDER — FUROSEMIDE 10 MG/ML IJ SOLN
40.0000 mg | Freq: Two times a day (BID) | INTRAMUSCULAR | Status: DC
  Administered 2015-03-19 – 2015-03-21 (×5): 40 mg via INTRAVENOUS
  Filled 2015-03-19 (×6): qty 4

## 2015-03-19 NOTE — ED Provider Notes (Signed)
TIME SEEN: 8:05 AM  CHIEF COMPLAINT: Short of breath, cough, rib pain  HPI: Pt is a 74 y.o. male with history of hypertension, diabetes, hyperlipidemia who presents to the emergency department with left-sided chest soreness, aching, shortness of breath, cough with clear sputum production and wheezing. Was recently admitted to [facility] on 03/16/15 for possible pneumonia and asthma exacerbation. States when he is discharged to is feeling much better. He is still on Levaquin and prednisone which he took this morning. States that yesterday his coughing, wheezing returned and he had an episode of coughing that cause and have significant left-sided chest pain that has been constant since. No history of CHF. States he was a smoker but quit 35 years ago. Denies a known history of COPD but was told that he may have asthma during his last admission. No history of PE or DVT. Denies fever. States his baseline oxygen level is around 90%. Does not wear oxygen at home.  ROS: See HPI Constitutional: no fever  Eyes: no drainage  ENT: no runny nose   Cardiovascular:   chest pain  Resp:  SOB  GI: no vomiting GU: no dysuria Integumentary: no rash  Allergy: no hives  Musculoskeletal: no leg swelling  Neurological: no slurred speech ROS otherwise negative  PAST MEDICAL HISTORY/PAST SURGICAL HISTORY:  Past Medical History  Diagnosis Date  . Diabetes mellitus   . Hypertension   . Arthritis   . Acid reflux   . High cholesterol     MEDICATIONS:  Prior to Admission medications   Medication Sig Start Date End Date Taking? Authorizing Provider  albuterol (PROVENTIL HFA;VENTOLIN HFA) 108 (90 BASE) MCG/ACT inhaler Inhale 2 puffs into the lungs every 6 (six) hours as needed for wheezing or shortness of breath. 03/17/15   Shanker Levora Dredge, MD  albuterol (PROVENTIL) (2.5 MG/3ML) 0.083% nebulizer solution Take 3 mLs (2.5 mg total) by nebulization every 2 (two) hours as needed for wheezing. 03/17/15   Shanker Levora Dredge, MD  azelastine (ASTELIN) 137 MCG/SPRAY nasal spray Place 2 sprays into the nose daily. Use in each nostril as directed    Historical Provider, MD  clotrimazole-betamethasone (LOTRISONE) cream Apply 1 application topically 3 (three) times daily.    Historical Provider, MD  cyclobenzaprine (FLEXERIL) 10 MG tablet Take 5-10 mg by mouth 3 (three) times daily as needed. For back spasms    Historical Provider, MD  Docusate Calcium (STOOL SOFTENER PO) Take 1 capsule by mouth daily.    Historical Provider, MD  escitalopram (LEXAPRO) 10 MG tablet Take 10 mg by mouth daily.    Historical Provider, MD  Fiber TABS Take 1 tablet by mouth daily.    Historical Provider, MD  fluticasone (FLONASE) 50 MCG/ACT nasal spray Place 2 sprays into the nose daily.    Historical Provider, MD  Fluticasone-Salmeterol (ADVAIR DISKUS) 250-50 MCG/DOSE AEPB Inhale 1 puff into the lungs 2 (two) times daily. 03/17/15   Shanker Levora Dredge, MD  guaiFENesin-codeine (ROBITUSSIN AC) 100-10 MG/5ML syrup Take 5 mLs by mouth 3 (three) times daily as needed for cough. 03/17/15   Shanker Levora Dredge, MD  levofloxacin (LEVAQUIN) 750 MG tablet Take 1 tablet (750 mg total) by mouth daily. 03/17/15   Shanker Levora Dredge, MD  levothyroxine (SYNTHROID, LEVOTHROID) 50 MCG tablet Take 50 mcg by mouth daily.    Historical Provider, MD  lisinopril-hydrochlorothiazide (PRINZIDE,ZESTORETIC) 10-12.5 MG per tablet Take 1 tablet by mouth daily.    Historical Provider, MD  loratadine (CLARITIN) 10 MG tablet  Take 10 mg by mouth daily.    Historical Provider, MD  lovastatin (MEVACOR) 20 MG tablet Take 20 mg by mouth at bedtime.    Historical Provider, MD  metFORMIN (GLUCOPHAGE) 500 MG tablet Take 500 mg by mouth daily.    Historical Provider, MD  omeprazole (PRILOSEC) 20 MG capsule Take 20 mg by mouth daily.    Historical Provider, MD  oxybutynin (DITROPAN) 5 MG tablet Take 5 mg by mouth 2 (two) times daily.    Historical Provider, MD  predniSONE (DELTASONE) 10  MG tablet Take 4 tablets (40 mg) daily for 2 days, then, Take 3 tablets (30 mg) daily for 2 days, then, Take 2 tablets (20 mg) daily for 2 days, then, Take 1 tablets (10 mg) daily for 1 days, then stop 03/17/15   Maretta BeesShanker M Ghimire, MD  Pseudoephedrine-APAP-DM (COMTREX COLD & COUGH PO) Take 1 capsule by mouth every 8 (eight) hours as needed. For cough and cold    Historical Provider, MD  Psyllium (METAMUCIL PO) Take 15 g by mouth daily.    Historical Provider, MD  Tamsulosin HCl (FLOMAX) 0.4 MG CAPS Take 0.4 mg by mouth at bedtime.    Historical Provider, MD  zolpidem (AMBIEN) 10 MG tablet Take 10 mg by mouth at bedtime as needed. For sleep    Historical Provider, MD    ALLERGIES:  Allergies  Allergen Reactions  . Sulfa Antibiotics Other (See Comments)    unknown    SOCIAL HISTORY:  History  Substance Use Topics  . Smoking status: Former Smoker -- 3.00 packs/day for 25 years    Types: Cigarettes    Quit date: 07/22/1983  . Smokeless tobacco: Not on file  . Alcohol Use: No    FAMILY HISTORY: Family History  Problem Relation Age of Onset  . Diabetes Mellitus II Father   . CAD Sister     EXAM: BP 164/95 mmHg  Pulse 109  Temp(Src) 98.8 F (37.1 C) (Oral)  Resp 24  Ht 5\' 11"  (1.803 m)  Wt 193 lb (87.544 kg)  BMI 26.93 kg/m2  SpO2 92% CONSTITUTIONAL: Alert and oriented and responds appropriately to questions. Elderly, in no distress HEAD: Normocephalic EYES: Conjunctivae clear, PERRL ENT: normal nose; no rhinorrhea; moist mucous membranes; pharynx without lesions noted NECK: Supple, no meningismus, no LAD  CARD: Regular and tachycardic; S1 and S2 appreciated; no murmurs, no clicks, no rubs, no gallops RESP: Normal chest excursion without splinting or tachypnea; breath sounds are equal bilaterally but he is slightly diminished at his bases and has diffuse progress breath sounds, mild expiratory wheezing, no rales, no hypoxia or respiratory distress, speaking full sentences,  left chest wall is tender to palpation without crepitus or ecchymosis or deformity ABD/GI: Normal bowel sounds; non-distended; soft, non-tender, no rebound, no guarding, no peritoneal signs BACK:  The back appears normal and is non-tender to palpation, there is no CVA tenderness EXT: Normal ROM in all joints; non-tender to palpation; no edema; normal capillary refill; no cyanosis, no calf tenderness or swelling    SKIN: Normal color for age and race; warm NEURO: Moves all extremities equally, sensation to light touch intact diffusely, cranial nerves II through XII intact PSYCH: The patient's mood and manner are appropriate. Grooming and personal hygiene are appropriate.  MEDICAL DECISION MAKING: Patient here with cough, shortness of breath and left chest wall pain. Recently admitted for asthma exacerbation and pneumonia. Is still on steroids and antibiotics. We'll repeat labs, chest x-ray. We'll give duo nebs, Solu-Medrol  and then ambulate to see if patient is hypoxic. He was hypoxic in the low 80s with ambulation prior to his last admission.  ED PROGRESS: 9:00 AM  Pt's breath sounds have improved significantly with duonebs and now moving good air without wheezing.  Reports feeling much better.  9:45 AM  Pt has a leukocytosis of 25.7 with left shift. This is increased since his discharge that may be secondary to being on steroids. He does now however have a sodium of 119, bicarbonate of 20 with normal anion gap. Glucose is 158. Troponin negative. BNP normal. Chest x-ray shows persistent interstitial prominence at the lung bases without consolidation, infiltrate or edema. His lungs are now clear to auscultation but his oxygen saturation is fluctuating in the upper 80s to low 90s. Given his sodium of 119 feel he will need to be admitted to the hospital again. He appears dry on exam and not volume overloaded. I will give him IV hydration. PCP is Dr. Tresa EndoKelly.  10:30 AM  Spoke with Dr. Darnelle Catalanama with hospitalist  service who agrees for [facility] Updated patient. From a respiratory standpoint he is markedly improved.    CRITICAL CARE Performed by: Raelyn NumberWARD, Makaio Mach N   Total critical care time: 45 minutes  Critical care time was exclusive of separately billable procedures and treating other patients.  Critical care was necessary to treat or prevent imminent or life-threatening deterioration.  Hyponatremia, requiring IVF.  Critical care was time spent personally by me on the following activities: development of treatment plan with patient and/or surrogate as well as nursing, discussions with consultants, evaluation of patient's response to treatment, examination of patient, obtaining history from patient or surrogate, ordering and performing treatments and interventions, ordering and review of laboratory studies, ordering and review of radiographic studies, pulse oximetry and re-evaluation of patient's condition.      EKG Interpretation  Date/Time:  Friday Mar 19 2015 08:24:18 EDT Ventricular Rate:  100 PR Interval:  182 QRS Duration: 96 QT Interval:  348 QTC Calculation: 448 R Axis:   -9 Text Interpretation:  Normal sinus rhythm Possible Left atrial enlargement Borderline ECG No significant change since last tracing Confirmed by Britney Newstrom,  DO, Latiffany Harwick [phone]([id number]) on 03/19/2015 8:32:20 AM        Layla MawKristen N Lilliann Rossetti, DO 03/19/15 1106

## 2015-03-19 NOTE — ED Notes (Signed)
[facility] at bedside 

## 2015-03-19 NOTE — ED Notes (Signed)
MD at bedside. 

## 2015-03-19 NOTE — ED Notes (Signed)
RRT at bedsidee for repeat neb tx.  Pt is talking in full sentences and appears in NAD.

## 2015-03-19 NOTE — Progress Notes (Signed)
CRITICAL VALUE ALERT  Critical value received:  Sodium 115  Date of notification:  03/19/2015  Time of notification:  1659  Critical value read back:Yes.    Nurse who received alert:  Kayren EavesLatazia Arjun Hard, Rn  MD notified (1st page):  S. Newton  Time of first page:  1701  MD notified (2nd page): n/a  Time of second page: n/a  Responding MD:  [place] BlasS. Newton  Time MD responded:  [phone]

## 2015-03-19 NOTE — ED Provider Notes (Signed)
8:00 AM  Incorrect patient registered.  This patient was not seen in the ED.  Richard MawKristen N Tobey Lippard, DO 03/19/15 [phone]

## 2015-03-19 NOTE — ED Notes (Signed)
Pt informed of plan of care and admission orders.  Discussed pt transferring to ED stretcher from stool in room and he refuses.  He states "Look, I feel better like this".  O2 applied for low SAO2 (86%).

## 2015-03-19 NOTE — ED Notes (Signed)
Report to Legrand Comoazia, RN unit

## 2015-03-19 NOTE — ED Notes (Signed)
Patient transported to X-ray ambulatory 

## 2015-03-19 NOTE — ED Notes (Signed)
[facility] is repaging hospitalist  For admission.

## 2015-03-19 NOTE — H&P (Addendum)
Hospitalist Admission History and Physical  Patient name: Richard PodRay Balfour Medical record number: [id number] Date of birth: [birth year] Age: 74 y.o. Gender: male  Primary Care Provider: Almedia BallsKELLY,SAM, MD  Chief Complaint: hyponatremia, dyspnea/acute resp failure w/ hypoxia, abd pain   History of Present Illness:This is a 74 y.o. year old male with significant past medical history of NIDDM, HTN, GERD presenting with hypontremia, dyspnea, abd pain. Pt noted to have been admitted 5/3-5/4 for acute resp failure w/ hypoxia in setting of asthma exacerbation. Pt was discharged from hospital w/ rx for prednisone and levaquin. Pt reports compliance w/ medication. Pt however, is an extremely poor historian. Pt is unable to give me an exact reason for coming to the hospital. "Lavenia Atlasve been stupid. Your doing a good job. Youre doing exactly what youre supposed to do". Pt cannot specify what was initial reason for coming in to the hospital. Pt does admit to SOB. No fevers or chills. L sided nonspecific CP-resolved. + generalized abd pain-though pt denies fevers, chills, nausea, vomiting, diarrhea. Does admit to worsening LE swelling since leaving the hospital. Denies any excess water intake. Denies ETOH abuse.  Presented to ER T 98.8, HR 90s-100s, resp 10s-20s, BP 130s-160s, Satting mid 80s on RA, mid 90s on 2L [place]. WBC 25.7, hgb 11.6, na 119 (na 130 on 5/3), Cr 1.36. BNP 36. CXR w/ persistent mild interstitial prominence in lung bases w/p focal consolidation.    Assessment and Plan: Richard Collier is a 74 y.o. year old male presenting with hyponatremia, dyspnea, abd pain    Active Problems:   Hyponatremia   AKI (acute kidney injury)   Abdominal pain   1- Hyponatremia -appears to be hypervolemic on exam clinically  -overall very poor historian  -2+ pitting edema in LEs bilaterally-no popliteal tenderness  -+ distended abdomen -CXR w/o edema -BNP WNL  -check FeNa, urine sodium, urine/plasma osm -CT abd abd pelvis to  assess for ?cirrhotic disease/ascites-pt denies ETOH abuse  -2D ECHO  -TSH -low dose diuretic to assess for response  -serial BMETs  -avoid > 8-12 mEq change over next 12-24 hours.   2- Acute resp failure w/ hypoxia/dyspnea -noted admission for similar sxs 5/3-5/4 -overall very poor historian -still with active wheezing on exam in setting of hypoxia  -convert oral levaquin and prednisone to IV equivalents -CT chest  -d dimer -VQ scan if markedly positive given AKI  -ABG -supplemental O2  -follow   3-Abd Pain  -marked abd distension on exam -denies diarrhea, vomiting -CT abd and pelvis  -f/u imaging  3- HTN -stable  -cont home regimen apart from HCTZ   4-Hypothyroidism -cont synthroid  -TSH  5-AKI -appears to be at/near baseline  -CT abd and pelvis pending -follow   6-NIDDM -SSI  -A1C -hold orals   7-Leukocytosis -likely secondary to glucocorticoid use -no overt signs of infection though w/ #1 -CXR w/o infiltrate -UA bland  -pan culture -follow -anticipate uptrend w/ IV steroid use   FEN/GI: heart healthy carb modified diet-PPi  Prophylaxis: sub q heparin  Disposition: pending further evaluation  Code Status:Full Code    Patient Active Problem List   Diagnosis Date Noted  . Hyponatremia 03/19/2015  . Productive cough 03/17/2015  . Acute respiratory failure with hypoxia 03/17/2015  . Hypothyroidism 03/17/2015  . Essential hypertension 03/17/2015  . Diabetes mellitus type 2, controlled 03/17/2015  . Normocytic anemia 03/17/2015  . Hyperlipidemia 03/17/2015  . Acute respiratory failure 03/17/2015  . Asthma exacerbation 03/16/2015   Past Medical History: Past  Medical History  Diagnosis Date  . Diabetes mellitus   . Hypertension   . Arthritis   . Acid reflux   . High cholesterol     Past Surgical History: Past Surgical History  Procedure Laterality Date  . Prostate surgery  Sept. 5 2013  . Colonoscopy      Social History: History    Social History  . Marital Status: Single    Spouse Name: N/A  . Number of Children: N/A  . Years of Education: N/A   Social History Main Topics  . Smoking status: Former Smoker -- 3.00 packs/day for 25 years    Types: Cigarettes    Quit date: 07/22/1983  . Smokeless tobacco: Not on file  . Alcohol Use: No  . Drug Use: No  . Sexual Activity: Not Currently   Other Topics Concern  . None   Social History Narrative    Family History: Family History  Problem Relation Age of Onset  . Diabetes Mellitus II Father   . CAD Sister     Allergies: Allergies  Allergen Reactions  . Sulfa Antibiotics Other (See Comments)    unknown    Current Facility-Administered Medications  Medication Dose Route Frequency Provider Last Rate Last Dose  . 0.9 %  sodium chloride infusion   Intravenous Continuous Kristen N Ward, DO 125 mL/hr at 03/19/15 1008    . 0.9 %  sodium chloride infusion  250 mL Intravenous PRN Floydene Flock, MD      . albuterol (PROVENTIL) (2.5 MG/3ML) 0.083% nebulizer solution 2.5 mg  2.5 mg Nebulization Q2H PRN Floydene Flock, MD      . escitalopram (LEXAPRO) tablet 10 mg  10 mg Oral Daily Floydene Flock, MD      . fentaNYL (SUBLIMAZE) injection 50 mcg  50 mcg Intravenous Q2H PRN Kristen N Ward, DO   50 mcg at 03/19/15 1035  . heparin injection 5,000 Units  5,000 Units Subcutaneous 3 times per day Floydene Flock, MD      . insulin aspart (novoLOG) injection 0-9 Units  0-9 Units Subcutaneous 6 times per day Floydene Flock, MD      . iohexol (OMNIPAQUE) 300 MG/ML solution 25 mL  25 mL Oral Q1 Hr x 2 Medication Radiologist, MD      . levofloxacin (LEVAQUIN) IVPB 750 mg  750 mg Intravenous Q24H Floydene Flock, MD      . levothyroxine (SYNTHROID, LEVOTHROID) tablet 50 mcg  50 mcg Oral Daily Floydene Flock, MD      . lisinopril-hydrochlorothiazide (PRINZIDE,ZESTORETIC) 10-12.5 MG per tablet 1 tablet  1 tablet Oral Daily Floydene Flock, MD      . methylPREDNISolone  sodium succinate (SOLU-MEDROL) 125 mg/2 mL injection 125 mg  125 mg Intravenous Q24H Floydene Flock, MD      . mometasone-formoterol [facility]) 100-5 MCG/ACT inhaler 2 puff  2 puff Inhalation BID Floydene Flock, MD      . oxybutynin (DITROPAN) tablet 5 mg  5 mg Oral BID Floydene Flock, MD      . pantoprazole (PROTONIX) EC tablet 40 mg  40 mg Oral Daily Floydene Flock, MD      . pravastatin (PRAVACHOL) tablet 20 mg  20 mg Oral q1800 Floydene Flock, MD      . sodium chloride 0.9 % injection 3 mL  3 mL Intravenous Q12H Floydene Flock, MD      . sodium chloride 0.9 % injection 3 mL  3 mL Intravenous Q12H Floydene FlockSteven J Myrla Malanowski, MD      . sodium chloride 0.9 % injection 3 mL  3 mL Intravenous PRN Floydene FlockSteven J Gayla Benn, MD       Review Of Systems: 12 point ROS negative except as noted above in HPI.  Physical Exam: Filed Vitals:   03/19/15 1202  BP: 132/92  Pulse: 90  Temp:   Resp: 18    General: alert and cooperative HEENT: PERRLA and extra ocular movement intact Heart: S1, S2 normal, no murmur, rub or gallop, regular rate and rhythm Lungs: unlabored breathing and expiratory wheezes Abdomen: + abd distension, mild generalized abd ttp, + bowel sounds  Extremities: 2+ peripheral pulses, 2+ edema bilaterally, no popliteal tenderness or calf assymetry Skin:no rashes Neurology: normal without focal findings  Labs and Imaging: Lab Results  Component Value Date/Time   NA 119* 03/19/2015 08:30 AM   K 4.3 03/19/2015 08:30 AM   CL 86* 03/19/2015 08:30 AM   CO2 20* 03/19/2015 08:30 AM   BUN 36* 03/19/2015 08:30 AM   CREATININE 1.36* 03/19/2015 08:30 AM   GLUCOSE 158* 03/19/2015 08:30 AM   Lab Results  Component Value Date   WBC 25.7* 03/19/2015   HGB 11.6* 03/19/2015   HCT 32.3* 03/19/2015   MCV 84.8 03/19/2015   PLT 318 03/19/2015   Urinalysis    Component Value Date/Time   COLORURINE YELLOW 03/19/2015 1140   APPEARANCEUR CLEAR 03/19/2015 1140   LABSPEC 1.018 03/19/2015 1140   PHURINE  5.5 03/19/2015 1140   GLUCOSEU NEGATIVE 03/19/2015 1140   HGBUR NEGATIVE 03/19/2015 1140   BILIRUBINUR NEGATIVE 03/19/2015 1140   KETONESUR NEGATIVE 03/19/2015 1140   PROTEINUR 100* 03/19/2015 1140   UROBILINOGEN 0.2 03/19/2015 1140   NITRITE NEGATIVE 03/19/2015 1140   LEUKOCYTESUR NEGATIVE 03/19/2015 1140       Dg Chest 2 View  03/19/2015   CLINICAL DATA:  Cough, constipation  EXAM: CHEST  2 VIEW  COMPARISON:  03/17/2015  FINDINGS: Cardiomediastinal silhouette is stable. No pulmonary edema. Persistent mild interstitial prominence lung bases without focal consolidation. No segmental infiltrate. Mild degenerative changes mid and lower thoracic spine.  IMPRESSION: No pulmonary edema. Persistent mild interstitial prominence lung bases without focal consolidation. No segmental infiltrate. Mild degenerative changes mid and lower thoracic spine.   Electronically Signed   By: Natasha MeadLiviu  Pop M.D.   On: 03/19/2015 09:01           Doree AlbeeSteven Shamekia Tippets MD  Pager: [phone]

## 2015-03-19 NOTE — ED Notes (Signed)
Pt presents with cough, difficulty breathing, and constipation.  [facility] and tells me [facility]  Presents with a letter with instructions on health care needs and instructions for his plan of care. States he doesn't want to talk.

## 2015-03-19 NOTE — ED Notes (Signed)
Pt presents with several complains and a piece of paper directing medical needs.  C/O constipation, cough and abdominal/rib pain.

## 2015-03-19 NOTE — ED Notes (Signed)
Patient ambulating in hallway to radiology for CXR,  HR 114, RR 22, SpO2 83-85% on room air.  Talking in short sentences, states he feels better after HHN.

## 2015-03-19 NOTE — Progress Notes (Signed)
*  PRELIMINARY RESULTS* Echocardiogram 2D Echocardiogram has been performed.  Jeryl Columbialliott, Makana Rostad 03/19/2015, 4:30 PM

## 2015-03-19 NOTE — Evaluation (Signed)
Noted follow-up sodium of 115. Discuss with nephrology around time of lab value. Agree with current treatment plan for IV Lasix. Hold oral thiazide. Every 4 hours metabolic profiles. Follow-up imaging. Strict ins and outs. Formal consult to nephrology as clinically indicated.

## 2015-03-20 ENCOUNTER — Inpatient Hospital Stay (HOSPITAL_COMMUNITY): Payer: Medicare Other

## 2015-03-20 DIAGNOSIS — D72829 Elevated white blood cell count, unspecified: Secondary | ICD-10-CM | POA: Diagnosis present

## 2015-03-20 DIAGNOSIS — E871 Hypo-osmolality and hyponatremia: Principal | ICD-10-CM

## 2015-03-20 DIAGNOSIS — M7989 Other specified soft tissue disorders: Secondary | ICD-10-CM

## 2015-03-20 DIAGNOSIS — R410 Disorientation, unspecified: Secondary | ICD-10-CM

## 2015-03-20 DIAGNOSIS — E119 Type 2 diabetes mellitus without complications: Secondary | ICD-10-CM

## 2015-03-20 DIAGNOSIS — N189 Chronic kidney disease, unspecified: Secondary | ICD-10-CM

## 2015-03-20 DIAGNOSIS — R1084 Generalized abdominal pain: Secondary | ICD-10-CM

## 2015-03-20 DIAGNOSIS — E038 Other specified hypothyroidism: Secondary | ICD-10-CM

## 2015-03-20 DIAGNOSIS — I10 Essential (primary) hypertension: Secondary | ICD-10-CM

## 2015-03-20 DIAGNOSIS — J9621 Acute and chronic respiratory failure with hypoxia: Secondary | ICD-10-CM

## 2015-03-20 DIAGNOSIS — N179 Acute kidney failure, unspecified: Secondary | ICD-10-CM

## 2015-03-20 DIAGNOSIS — J441 Chronic obstructive pulmonary disease with (acute) exacerbation: Secondary | ICD-10-CM | POA: Diagnosis present

## 2015-03-20 LAB — COMPREHENSIVE METABOLIC PANEL
ALBUMIN: 2.9 g/dL — AB (ref 3.5–5.0)
ALBUMIN: 2.8 g/dL — AB (ref 3.5–5.0)
ALK PHOS: 66 U/L (ref 38–126)
ALT: 32 U/L (ref 17–63)
ALT: 28 U/L (ref 17–63)
ALT: 32 U/L (ref 17–63)
ALT: 30 U/L (ref 17–63)
ALT: 28 U/L (ref 17–63)
ANION GAP: 10 (ref 5–15)
AST: 49 U/L — ABNORMAL HIGH (ref 15–41)
AST: 42 U/L — ABNORMAL HIGH (ref 15–41)
AST: 43 U/L — ABNORMAL HIGH (ref 15–41)
AST: 39 U/L (ref 15–41)
AST: 35 U/L (ref 15–41)
Albumin: 3.4 g/dL — ABNORMAL LOW (ref 3.5–5.0)
Albumin: 3.1 g/dL — ABNORMAL LOW (ref 3.5–5.0)
Albumin: 3.2 g/dL — ABNORMAL LOW (ref 3.5–5.0)
Alkaline Phosphatase: 56 U/L (ref 38–126)
Alkaline Phosphatase: 64 U/L (ref 38–126)
Alkaline Phosphatase: 62 U/L (ref 38–126)
Alkaline Phosphatase: 58 U/L (ref 38–126)
Anion gap: 13 (ref 5–15)
Anion gap: 14 (ref 5–15)
Anion gap: 14 (ref 5–15)
Anion gap: 7 (ref 5–15)
BILIRUBIN TOTAL: 0.8 mg/dL (ref 0.3–1.2)
BILIRUBIN TOTAL: 0.8 mg/dL (ref 0.3–1.2)
BUN: 27 mg/dL — ABNORMAL HIGH (ref 6–20)
BUN: 28 mg/dL — ABNORMAL HIGH (ref 6–20)
BUN: 26 mg/dL — ABNORMAL HIGH (ref 6–20)
BUN: 27 mg/dL — ABNORMAL HIGH (ref 6–20)
BUN: 30 mg/dL — ABNORMAL HIGH (ref 6–20)
CALCIUM: 8.2 mg/dL — AB (ref 8.9–10.3)
CALCIUM: 8.4 mg/dL — AB (ref 8.9–10.3)
CALCIUM: 8.2 mg/dL — AB (ref 8.9–10.3)
CHLORIDE: 80 mmol/L — AB (ref 101–111)
CO2: 21 mmol/L — AB (ref 22–32)
CO2: 21 mmol/L — ABNORMAL LOW (ref 22–32)
CO2: 24 mmol/L (ref 22–32)
CO2: 24 mmol/L (ref 22–32)
CO2: 23 mmol/L (ref 22–32)
CREATININE: 1.52 mg/dL — AB (ref 0.61–1.24)
CREATININE: 1.51 mg/dL — AB (ref 0.61–1.24)
Calcium: 8.5 mg/dL — ABNORMAL LOW (ref 8.9–10.3)
Calcium: 7.9 mg/dL — ABNORMAL LOW (ref 8.9–10.3)
Chloride: 79 mmol/L — ABNORMAL LOW (ref 101–111)
Chloride: 80 mmol/L — ABNORMAL LOW (ref 101–111)
Chloride: 78 mmol/L — ABNORMAL LOW (ref 101–111)
Chloride: 83 mmol/L — ABNORMAL LOW (ref 101–111)
Creatinine, Ser: 1.36 mg/dL — ABNORMAL HIGH (ref 0.61–1.24)
Creatinine, Ser: 1.44 mg/dL — ABNORMAL HIGH (ref 0.61–1.24)
Creatinine, Ser: 1.67 mg/dL — ABNORMAL HIGH (ref 0.61–1.24)
GFR calc Af Amer: 52 mL/min — ABNORMAL LOW (ref >60)
GFR calc Af Amer: 46 mL/min — ABNORMAL LOW (ref >60)
GFR calc non Af Amer: 44 mL/min — ABNORMAL LOW (ref >60)
GFR calc non Af Amer: 45 mL/min — ABNORMAL LOW (ref >60)
GFR calc non Af Amer: 40 mL/min — ABNORMAL LOW (ref >60)
GFR, EST AFRICAN AMERICAN: 59 mL/min — AB (ref >60)
GFR, EST AFRICAN AMERICAN: 55 mL/min — AB (ref >60)
GFR, EST AFRICAN AMERICAN: 51 mL/min — AB (ref >60)
GFR, EST NON AFRICAN AMERICAN: 51 mL/min — AB (ref >60)
GFR, EST NON AFRICAN AMERICAN: 47 mL/min — AB (ref >60)
GLUCOSE: 108 mg/dL — AB (ref 70–99)
GLUCOSE: 134 mg/dL — AB (ref 70–99)
Glucose, Bld: 104 mg/dL — ABNORMAL HIGH (ref 70–99)
Glucose, Bld: 173 mg/dL — ABNORMAL HIGH (ref 70–99)
Glucose, Bld: 134 mg/dL — ABNORMAL HIGH (ref 70–99)
POTASSIUM: 3.8 mmol/L (ref 3.5–5.1)
POTASSIUM: 3.7 mmol/L (ref 3.5–5.1)
Potassium: 3.9 mmol/L (ref 3.5–5.1)
Potassium: 3.7 mmol/L (ref 3.5–5.1)
Potassium: 3.5 mmol/L (ref 3.5–5.1)
SODIUM: 116 mmol/L — AB (ref 135–145)
Sodium: 113 mmol/L — CL (ref 135–145)
Sodium: 115 mmol/L — CL (ref 135–145)
Sodium: 114 mmol/L — CL (ref 135–145)
Sodium: 113 mmol/L — CL (ref 135–145)
TOTAL PROTEIN: 6.1 g/dL — AB (ref 6.5–8.1)
Total Bilirubin: 0.8 mg/dL (ref 0.3–1.2)
Total Bilirubin: 1 mg/dL (ref 0.3–1.2)
Total Bilirubin: 0.6 mg/dL (ref 0.3–1.2)
Total Protein: 6.8 g/dL (ref 6.5–8.1)
Total Protein: 6.3 g/dL — ABNORMAL LOW (ref 6.5–8.1)
Total Protein: 6.3 g/dL — ABNORMAL LOW (ref 6.5–8.1)
Total Protein: 5.7 g/dL — ABNORMAL LOW (ref 6.5–8.1)

## 2015-03-20 LAB — CBC WITH DIFFERENTIAL/PLATELET
Basophils Absolute: 0 10*3/uL (ref 0.0–0.1)
Basophils Relative: 0 % (ref 0–1)
EOS ABS: 0 10*3/uL (ref 0.0–0.7)
Eosinophils Relative: 0 % (ref 0–5)
HEMATOCRIT: 31.6 % — AB (ref 39.0–52.0)
Hemoglobin: 11.3 g/dL — ABNORMAL LOW (ref 13.0–17.0)
Lymphocytes Relative: 16 % (ref 12–46)
Lymphs Abs: 3.8 10*3/uL (ref 0.7–4.0)
MCH: 29.7 pg (ref 26.0–34.0)
MCHC: 35.8 g/dL (ref 30.0–36.0)
MCV: 82.9 fL (ref 78.0–100.0)
MONO ABS: 2.4 10*3/uL — AB (ref 0.1–1.0)
Monocytes Relative: 10 % (ref 3–12)
NEUTROS ABS: 17.7 10*3/uL — AB (ref 1.7–7.7)
Neutrophils Relative %: 74 % (ref 43–77)
Platelets: 307 10*3/uL (ref 150–400)
RBC: 3.81 MIL/uL — ABNORMAL LOW (ref 4.22–5.81)
RDW: 13.7 % (ref 11.5–15.5)
WBC: 23.9 10*3/uL — ABNORMAL HIGH (ref 4.0–10.5)

## 2015-03-20 LAB — GLUCOSE, CAPILLARY
GLUCOSE-CAPILLARY: 142 mg/dL — AB (ref 70–99)
GLUCOSE-CAPILLARY: 176 mg/dL — AB (ref 70–99)
Glucose-Capillary: 106 mg/dL — ABNORMAL HIGH (ref 70–99)
Glucose-Capillary: 111 mg/dL — ABNORMAL HIGH (ref 70–99)
Glucose-Capillary: 131 mg/dL — ABNORMAL HIGH (ref 70–99)
Glucose-Capillary: 186 mg/dL — ABNORMAL HIGH (ref 70–99)

## 2015-03-20 LAB — URINE CULTURE
Colony Count: NO GROWTH
Culture: NO GROWTH

## 2015-03-20 LAB — LACTIC ACID, PLASMA
LACTIC ACID, VENOUS: 1 mmol/L (ref 0.5–2.0)
Lactic Acid, Venous: 1.2 mmol/L (ref 0.5–2.0)

## 2015-03-20 LAB — HEMOGLOBIN A1C
Hgb A1c MFr Bld: 6.4 % — ABNORMAL HIGH (ref 4.8–5.6)
Mean Plasma Glucose: 137 mg/dL

## 2015-03-20 LAB — OSMOLALITY: Osmolality: 253 mOsm/kg — ABNORMAL LOW (ref 275–300)

## 2015-03-20 LAB — OSMOLALITY, URINE: Osmolality, Ur: 324 mOsm/kg — ABNORMAL LOW (ref 390–1090)

## 2015-03-20 MED ORDER — ALBUTEROL SULFATE (2.5 MG/3ML) 0.083% IN NEBU: 2.5000 mg | INHALATION_SOLUTION | RESPIRATORY_TRACT | Status: DC | PRN

## 2015-03-20 MED ORDER — TECHNETIUM TC 99M DIETHYLENETRIAME-PENTAACETIC ACID: 40.0000 | Freq: Once | INTRAVENOUS | Status: AC | PRN

## 2015-03-20 MED ORDER — SODIUM CHLORIDE 1 G PO TABS
2.0000 g | ORAL_TABLET | Freq: Three times a day (TID) | ORAL | Status: DC
Start: 2015-03-20 — End: 2015-03-21
  Administered 2015-03-20 – 2015-03-21 (×3): 2 g via ORAL
  Filled 2015-03-20 (×5): qty 2

## 2015-03-20 MED ORDER — TECHNETIUM TO 99M ALBUMIN AGGREGATED
6.0000 | Freq: Once | INTRAVENOUS | Status: AC | PRN
  Administered 2015-03-20: 6 via INTRAVENOUS

## 2015-03-20 MED ORDER — SODIUM CHLORIDE 1 G PO TABS
2.0000 g | ORAL_TABLET | Freq: Once | ORAL | Status: AC
  Administered 2015-03-20: 2 g via ORAL
  Filled 2015-03-20: qty 2

## 2015-03-20 MED ORDER — IPRATROPIUM-ALBUTEROL 0.5-2.5 (3) MG/3ML IN SOLN
3.0000 mL | Freq: Three times a day (TID) | RESPIRATORY_TRACT | Status: DC
  Administered 2015-03-21 – 2015-03-24 (×9): 3 mL via RESPIRATORY_TRACT
  Filled 2015-03-20 (×10): qty 3

## 2015-03-20 MED ORDER — METHYLPREDNISOLONE SODIUM SUCC 125 MG IJ SOLR
60.0000 mg | INTRAMUSCULAR | Status: DC
  Administered 2015-03-21 – 2015-03-22 (×2): 60 mg via INTRAVENOUS
  Filled 2015-03-20: qty 0.96
  Filled 2015-03-20: qty 2
  Filled 2015-03-20: qty 0.96

## 2015-03-20 MED ORDER — OXYCODONE HCL 5 MG PO TABS: 5.0000 mg | ORAL_TABLET | ORAL | Status: DC | PRN

## 2015-03-20 MED ORDER — IPRATROPIUM-ALBUTEROL 0.5-2.5 (3) MG/3ML IN SOLN
3.0000 mL | Freq: Four times a day (QID) | RESPIRATORY_TRACT | Status: DC
  Administered 2015-03-20: 3 mL via RESPIRATORY_TRACT
  Filled 2015-03-20: qty 3

## 2015-03-20 NOTE — Progress Notes (Signed)
  CRITICAL VALUE ALERT  Critical value received:  Sodium 116  Date of notification:  03/20/15  Time of notification:  1235  Critical value read back yes  Nurse who received alert:  Freddy Jakschorothy RN   MD notified (1st page): Dr Joseph ArtWoods  Time of first page:  1300

## 2015-03-20 NOTE — Progress Notes (Signed)
CRITICAL VALUE ALERT  Critical value received:  Na 113  Date of notification:  03/20/2015  Time of notification:  2255  Critical value read back: yes  Nurse who received alert: Rosamaria LintsKelsey Adewale Pucillo, RN  MD notified (1st page):  Kirtland BouchardK. Schorr  Time of first page:  2256  MD notified (2nd page):  Time of second page:  Responding MD:  K. Shorr  Time MD responded:  No response, Na known to be low and interventions are in place and being monitored

## 2015-03-20 NOTE — Progress Notes (Signed)
CRITICAL VALUE ALERT  Critical value received:  Sodium 113  Date of notification:  03/20/15  Time of notification:  0443  Critical value read back: YES  Nurse who received alert: Janice NorrieMisty Ennis, RN, BSN  MD notified (1st page):  Toniann FailKakrakandy, MD  Time of first page:  [phone]  MD notified (2nd page):  Time of second page:  Responding MD:  Toniann FailKakrakandy, MD  Time MD responded: [id number]

## 2015-03-20 NOTE — Progress Notes (Addendum)
[place] TEAM 1 - Stepdown/ICU TEAM Progress Note  Richard Collier [id number] DOB: Oct 26, 1941 DOA: 03/19/2015 PCP: Almedia BallsKELLY,SAM, MD  Admit HPI / Brief Narrative52: 74 y.o. year WM PMHx NIDDM, HTN, HLD, GERD  Presenting with hypontremia, dyspnea, abd pain. Pt noted to have been admitted 5/3-5/4 for acute resp failure w/ hypoxia in setting of asthma exacerbation. Pt was discharged from hospital w/ rx for prednisone and levaquin. Pt reports compliance w/ medication. Pt however, is an extremely poor historian. Pt is unable to give me an exact reason for coming to the hospital. "Lavenia Atlasve been stupid. Your doing a good job. Youre doing exactly what youre supposed to do". Pt cannot specify what was initial reason for coming in to the hospital. Pt does admit to SOB. No fevers or chills. L sided nonspecific CP-resolved. + generalized abd pain-though pt denies fevers, chills, nausea, vomiting, diarrhea. Does admit to worsening LE swelling since leaving the hospital. Denies any excess water intake. Denies ETOH abuse.  Presented to ER T 98.8, HR 90s-100s, resp 10s-20s, BP 130s-160s, Satting mid 80s on RA, mid 90s on 2L [place]. WBC 25.7, hgb 11.6, na 119 (na 130 on 5/3), Cr 1.36. BNP 36. CXR w/ persistent mild interstitial prominence in lung bases w/p focal consolidation  HPI/Subjective: 5/7 review of patient's EMR shows that he was discharged on 5/4 with 5 tablets of levofloxacin in order to complete his treatment for pneumonia, appears patient has not completed course of treatment (stop taking when?)   Assessment/Plan: Acute mental status change -Multifactorial to include hyponatremia, acute respiratory failure with hypoxia, secondary to COPD exacerbation -Today A/O 4  Hyponatremia (acute); baseline~130 -Multifactorial to include partially treated COPD exacerbation, hypervolemia -2+ pitting edema in LLEs  -Doppler ultrasound lower extremities negative DVT/SVT -Urine low 324  -Serum osmolality low 253 -check FeNa,  urine sodium, urine/plasma osm -Uric acid normal -Echocardiogram; diastolic dysfunction mild    -TSH; within normal limit -low dose diuretic to assess for response  -Salt tablets 2 gm QAC  Acute resp failure w/ hypoxia/dyspnea/COPD exacerbation -Continue levofloxacin and complete 7 day course   -supplemental O2 to maintain SPO2 89-93% -Flutter valve -Solu-Medrol 60 mg daily - DuoNeb QID  Abd Pain  -Resolved -CT abd and pelvis; initial showed distended bladder with mild hydronephrosis  -Strict in and out; since admission -2.4 L  HTN -stable  -cont home regimen apart from HCTZ   Hypothyroidism -TSH; within normal limit -cont synthroid 50 g daily  Acute on chronic renal failure (baselineCr~1.23-1.36) -Stable, but slightly elevated continue to monitor.   Diabetes type 2 controlled -5/6 hemoglobin A1c = 6.4  - Continue sensitive SSI  Leukocytosis -Most likely multifactorial to include partially treated COPD exacerbation, and glucocorticoid use -Monitor closely for overt signs of infection fever, left shift, and bands  -CXR w/o infiltrate -UA bland  -pan culture pending    Code Status: FULL Family Communication: no family present at time of exam Disposition Plan: Resolution acute mental status change    Consultants: NA  Procedure/Significant Events: 5/6 echocardiogram;- Left ventricle: mild focal basal hypertrophy of the septum.  -LVEF= 60% to 65%.-(grade 1 diastolic dysfunction). -Aortic root: mildly dilated. 5/6 CT chest without contrast; Diffuse emphysematous changes 5/6 CT abdomen/pelvis w/o contrast; Over distended bladder; mild hydronephrosis Rt> Lt   5/7 bilateral lower extremity Doppler; negative DVT/SVT 5/7 VQ scan; low probability PE  Culture 5/6 blood left antecubital/right hand NGTD 5/6 urine pending   Antibiotics: Levofloxacin 5/6>>  DVT prophylaxis: Heparin subcutaneous   Devices  LINES / TUBES:      Continuous  Infusions:   Objective: VITAL SIGNS: Temp: 98.4 F (36.9 C) (05/07 1949) Temp Source: Oral (05/07 1949) BP: 105/57 mmHg (05/07 2000) Pulse Rate: 97 (05/07 2013) SPO2; FIO2:   Intake/Output Summary (Last 24 hours) at 03/20/15 2204 Last data filed at 03/20/15 1948  Gross per 24 hour  Intake    390 ml  Output   2625 ml  Net  -2235 ml     Exam: General: A/O 4, NAD, No acute respiratory distress Lungs: Clear to auscultation bilaterally with mild expiratory wheezing,  Cardiovascular: Regular rate and rhythm without murmur gallop or rub normal S1 and S2 Abdomen: Nontender, nondistended, soft, bowel sounds positive, no rebound, no ascites, no appreciable mass Extremities: No significant cyanosis, clubbing, bilateral pedal edema 2-3+ Lt>>Rt, left lower extremity painful to palpation. Left knee hot to touch, mildly erythematous    Data Reviewed: Basic Metabolic Panel:  Recent Labs Lab 03/19/15 2024 03/20/15 0255 03/20/15 0711 03/20/15 1235 03/20/15 1642  NA 114* 113* 115* 116* 114*  K 4.5 3.8 3.7 3.9 3.7  CL 79* 79* 80* 78* 80*  CO2 23 21* 21* 24 24  GLUCOSE 115* 108* 104* 134* 173*  BUN 28* 27* 28* 26* 27*  CREATININE 1.41* 1.36* 1.44* 1.52* 1.51*  CALCIUM 8.3* 8.5* 8.2* 8.4* 8.2*   Liver Function Tests:  Recent Labs Lab 03/19/15 2024 03/20/15 0255 03/20/15 0711 03/20/15 1235 03/20/15 1642  AST 45* 49* 42* 43* 39  ALT 32 32 28 32 30  ALKPHOS 61 66 56 64 62  BILITOT 0.5 0.8 0.8 1.0 0.8  PROT 6.3* 6.8 6.1* 6.3* 6.3*  ALBUMIN 3.2* 3.4* 3.1* 3.2* 2.9*   No results for input(s): LIPASE, AMYLASE in the last 168 hours. No results for input(s): AMMONIA in the last 168 hours. CBC:  Recent Labs Lab 03/16/15 1825 03/17/15 0630 03/19/15 0830 03/19/15 1552 03/20/15 0255  WBC 14.4* 16.4* 25.7* 19.4* 23.9*  NEUTROABS 10.8*  --  20.8*  --  17.7*  HGB 12.6* 11.9* 11.6* 10.9* 11.3*  HCT 36.5* 34.4* 32.3* 31.1* 31.6*  MCV 88.2 86.2 84.8 84.1 82.9  PLT 200 209 318  272 307   Cardiac Enzymes:  Recent Labs Lab 03/19/15 0830  TROPONINI <0.03   BNP (last 3 results)  Recent Labs  03/17/15 0630 03/19/15 0830  BNP 56.6 32.9    ProBNP (last 3 results) No results for input(s): PROBNP in the last 8760 hours.  CBG:  Recent Labs Lab 03/20/15 0402 03/20/15 0751 03/20/15 1149 03/20/15 1701 03/20/15 2006  GLUCAP 111* 106* 142* 186* 176*    Recent Results (from the past 240 hour(s))  MRSA PCR Screening     Status: None   Collection Time: 03/17/15  3:06 AM  Result Value Ref Range Status   MRSA by PCR NEGATIVE NEGATIVE Final    Comment:        The GeneXpert MRSA Assay (FDA approved for NASAL specimens only), is one component of a comprehensive MRSA colonization surveillance program. It is not intended to diagnose MRSA infection nor to guide or monitor treatment for MRSA infections.   Culture, blood (routine x 2)     Status: None (Preliminary result)   Collection Time: 03/19/15  4:14 PM  Result Value Ref Range Status   Specimen Description BLOOD LEFT ANTECUBITAL  Final   Special Requests BOTTLES DRAWN AEROBIC ONLY [facility]  Final   Culture   Final  BLOOD CULTURE RECEIVED NO GROWTH TO DATE CULTURE WILL BE HELD FOR 5 DAYS BEFORE ISSUING A FINAL NEGATIVE REPORT Performed at [facility]    Report Status PENDING  Incomplete  Culture, blood (routine x 2)     Status: None (Preliminary result)   Collection Time: 03/19/15  4:20 PM  Result Value Ref Range Status   Specimen Description BLOOD BLOOD RIGHT HAND  Final   Special Requests BOTTLES DRAWN AEROBIC ONLY 8CC  Final   Culture   Final           BLOOD CULTURE RECEIVED NO GROWTH TO DATE CULTURE WILL BE HELD FOR 5 DAYS BEFORE ISSUING A FINAL NEGATIVE REPORT Performed at [facility]    Report Status PENDING  Incomplete  Culture, Urine     Status: None   Collection Time: 03/19/15  7:06 PM  Result Value Ref Range Status   Specimen Description URINE, RANDOM  Final    Special Requests NONE  Final   Colony Count NO GROWTH Performed at [facility]   Final   Culture NO GROWTH Performed at [facility]   Final   Report Status 03/20/2015 FINAL  Final     Studies:  Recent x-ray studies have been reviewed in detail by the Attending Physician  Scheduled Meds:  Scheduled Meds: . escitalopram  10 mg Oral Daily  . furosemide  40 mg Intravenous Q12H  . heparin  5,000 Units Subcutaneous 3 times per day  . insulin aspart  0-9 Units Subcutaneous 6 times per day  . ipratropium-albuterol  3 mL Nebulization Q6H  . levofloxacin (LEVAQUIN) IV  750 mg Intravenous Q24H  . levothyroxine  50 mcg Oral QAC breakfast  . lisinopril  10 mg Oral Daily  . [START ON 03/21/2015] methylPREDNISolone (SOLU-MEDROL) injection  60 mg Intravenous Q24H  . mometasone-formoterol  2 puff Inhalation BID  . pantoprazole  80 mg Oral Daily  . pravastatin  20 mg Oral q1800  . sodium chloride  3 mL Intravenous Q12H  . sodium chloride  2 g Oral TID WC    Time spent on care of this patient: 40 mins   WOODS, Roselind Messier , MD  [facility] Office  [phone] Pager - [phone]  On-Call/Text Page:      [email or web address]      password [email or web address]  If 7PM-7AM, please contact night-coverage [email or web address] Password [email or web address] 03/20/2015, 10:04 PM   LOS: 1 day   Care during the described time interval was provided by me .  I have reviewed this patient's available data, including medical history, events of note, physical examination, radiology studies and test results as part of my evaluation  Carolyne Littles, MD [phone] Pager

## 2015-03-20 NOTE — Progress Notes (Signed)
VASCULAR LAB PRELIMINARY  PRELIMINARY  PRELIMINARY  PRELIMINARY  Bilateral lower extremity venous Dopplers completed.    Preliminary report:  There is no DVT or SVT noted in the bilateral lower extremities.   Richard Collier, RVT 03/20/2015, 6:00 PM

## 2015-03-21 LAB — COMPREHENSIVE METABOLIC PANEL
ALK PHOS: 57 U/L (ref 38–126)
ALT: 28 U/L (ref 17–63)
ALT: 28 U/L (ref 17–63)
ALT: 27 U/L (ref 17–63)
ALT: 27 U/L (ref 17–63)
ALT: 29 U/L (ref 17–63)
ANION GAP: 11 (ref 5–15)
ANION GAP: 13 (ref 5–15)
AST: 33 U/L (ref 15–41)
AST: 35 U/L (ref 15–41)
AST: 34 U/L (ref 15–41)
AST: 31 U/L (ref 15–41)
AST: 31 U/L (ref 15–41)
Albumin: 2.8 g/dL — ABNORMAL LOW (ref 3.5–5.0)
Albumin: 2.9 g/dL — ABNORMAL LOW (ref 3.5–5.0)
Albumin: 3 g/dL — ABNORMAL LOW (ref 3.5–5.0)
Albumin: 2.9 g/dL — ABNORMAL LOW (ref 3.5–5.0)
Albumin: 3.2 g/dL — ABNORMAL LOW (ref 3.5–5.0)
Alkaline Phosphatase: 57 U/L (ref 38–126)
Alkaline Phosphatase: 55 U/L (ref 38–126)
Alkaline Phosphatase: 59 U/L (ref 38–126)
Alkaline Phosphatase: 65 U/L (ref 38–126)
Anion gap: 11 (ref 5–15)
Anion gap: 11 (ref 5–15)
Anion gap: 13 (ref 5–15)
BILIRUBIN TOTAL: 0.6 mg/dL (ref 0.3–1.2)
BUN: 29 mg/dL — ABNORMAL HIGH (ref 6–20)
BUN: 28 mg/dL — ABNORMAL HIGH (ref 6–20)
BUN: 28 mg/dL — ABNORMAL HIGH (ref 6–20)
BUN: 29 mg/dL — ABNORMAL HIGH (ref 6–20)
BUN: 31 mg/dL — AB (ref 6–20)
CALCIUM: 8.3 mg/dL — AB (ref 8.9–10.3)
CALCIUM: 8.7 mg/dL — AB (ref 8.9–10.3)
CO2: 24 mmol/L (ref 22–32)
CO2: 23 mmol/L (ref 22–32)
CO2: 22 mmol/L (ref 22–32)
CO2: 24 mmol/L (ref 22–32)
CO2: 25 mmol/L (ref 22–32)
Calcium: 8.1 mg/dL — ABNORMAL LOW (ref 8.9–10.3)
Calcium: 8.4 mg/dL — ABNORMAL LOW (ref 8.9–10.3)
Calcium: 8.1 mg/dL — ABNORMAL LOW (ref 8.9–10.3)
Chloride: 83 mmol/L — ABNORMAL LOW (ref 101–111)
Chloride: 84 mmol/L — ABNORMAL LOW (ref 101–111)
Chloride: 84 mmol/L — ABNORMAL LOW (ref 101–111)
Chloride: 84 mmol/L — ABNORMAL LOW (ref 101–111)
Chloride: 85 mmol/L — ABNORMAL LOW (ref 101–111)
Creatinine, Ser: 1.57 mg/dL — ABNORMAL HIGH (ref 0.61–1.24)
Creatinine, Ser: 1.46 mg/dL — ABNORMAL HIGH (ref 0.61–1.24)
Creatinine, Ser: 1.51 mg/dL — ABNORMAL HIGH (ref 0.61–1.24)
Creatinine, Ser: 1.58 mg/dL — ABNORMAL HIGH (ref 0.61–1.24)
Creatinine, Ser: 1.6 mg/dL — ABNORMAL HIGH (ref 0.61–1.24)
GFR calc Af Amer: 49 mL/min — ABNORMAL LOW (ref >60)
GFR calc Af Amer: 52 mL/min — ABNORMAL LOW (ref >60)
GFR calc Af Amer: 49 mL/min — ABNORMAL LOW (ref >60)
GFR calc non Af Amer: 43 mL/min — ABNORMAL LOW (ref >60)
GFR calc non Af Amer: 45 mL/min — ABNORMAL LOW (ref >60)
GFR calc non Af Amer: 42 mL/min — ABNORMAL LOW (ref >60)
GFR calc non Af Amer: 42 mL/min — ABNORMAL LOW (ref >60)
GFR, EST AFRICAN AMERICAN: 54 mL/min — AB (ref >60)
GFR, EST AFRICAN AMERICAN: 48 mL/min — AB (ref >60)
GFR, EST NON AFRICAN AMERICAN: 47 mL/min — AB (ref >60)
GLUCOSE: 100 mg/dL — AB (ref 70–99)
GLUCOSE: 158 mg/dL — AB (ref 70–99)
Glucose, Bld: 110 mg/dL — ABNORMAL HIGH (ref 70–99)
Glucose, Bld: 103 mg/dL — ABNORMAL HIGH (ref 70–99)
Glucose, Bld: 157 mg/dL — ABNORMAL HIGH (ref 70–99)
POTASSIUM: 3.7 mmol/L (ref 3.5–5.1)
Potassium: 3.6 mmol/L (ref 3.5–5.1)
Potassium: 3.4 mmol/L — ABNORMAL LOW (ref 3.5–5.1)
Potassium: 3.3 mmol/L — ABNORMAL LOW (ref 3.5–5.1)
Potassium: 3.7 mmol/L (ref 3.5–5.1)
SODIUM: 118 mmol/L — AB (ref 135–145)
SODIUM: 119 mmol/L — AB (ref 135–145)
Sodium: 118 mmol/L — CL (ref 135–145)
Sodium: 119 mmol/L — CL (ref 135–145)
Sodium: 123 mmol/L — ABNORMAL LOW (ref 135–145)
TOTAL PROTEIN: 5.7 g/dL — AB (ref 6.5–8.1)
TOTAL PROTEIN: 5.8 g/dL — AB (ref 6.5–8.1)
TOTAL PROTEIN: 6.8 g/dL (ref 6.5–8.1)
Total Bilirubin: 0.6 mg/dL (ref 0.3–1.2)
Total Bilirubin: 0.7 mg/dL (ref 0.3–1.2)
Total Bilirubin: 0.6 mg/dL (ref 0.3–1.2)
Total Bilirubin: 0.6 mg/dL (ref 0.3–1.2)
Total Protein: 5.6 g/dL — ABNORMAL LOW (ref 6.5–8.1)
Total Protein: 5.9 g/dL — ABNORMAL LOW (ref 6.5–8.1)

## 2015-03-21 LAB — CBC WITH DIFFERENTIAL/PLATELET
Basophils Absolute: 0 10*3/uL (ref 0.0–0.1)
Basophils Relative: 0 % (ref 0–1)
EOS PCT: 0 % (ref 0–5)
Eosinophils Absolute: 0 10*3/uL (ref 0.0–0.7)
HCT: 30.4 % — ABNORMAL LOW (ref 39.0–52.0)
HEMOGLOBIN: 10.8 g/dL — AB (ref 13.0–17.0)
Lymphocytes Relative: 18 % (ref 12–46)
Lymphs Abs: 4.5 10*3/uL — ABNORMAL HIGH (ref 0.7–4.0)
MCH: 29.7 pg (ref 26.0–34.0)
MCHC: 35.5 g/dL (ref 30.0–36.0)
MCV: 83.5 fL (ref 78.0–100.0)
MONO ABS: 2.5 10*3/uL — AB (ref 0.1–1.0)
MONOS PCT: 10 % (ref 3–12)
NEUTROS PCT: 72 % (ref 43–77)
Neutro Abs: 18.1 10*3/uL — ABNORMAL HIGH (ref 1.7–7.7)
Platelets: 258 10*3/uL (ref 150–400)
RBC: 3.64 MIL/uL — AB (ref 4.22–5.81)
RDW: 13.8 % (ref 11.5–15.5)
WBC: 25.1 10*3/uL — AB (ref 4.0–10.5)

## 2015-03-21 LAB — GLUCOSE, CAPILLARY
GLUCOSE-CAPILLARY: 112 mg/dL — AB (ref 70–99)
GLUCOSE-CAPILLARY: 100 mg/dL — AB (ref 70–99)
GLUCOSE-CAPILLARY: 152 mg/dL — AB (ref 70–99)
Glucose-Capillary: 101 mg/dL — ABNORMAL HIGH (ref 70–99)
Glucose-Capillary: 173 mg/dL — ABNORMAL HIGH (ref 70–99)

## 2015-03-21 LAB — MAGNESIUM: MAGNESIUM: 1.9 mg/dL (ref 1.7–2.4)

## 2015-03-21 MED ORDER — LEVOFLOXACIN IN D5W 750 MG/150ML IV SOLN
750.0000 mg | INTRAVENOUS | Status: DC
  Administered 2015-03-22: 750 mg via INTRAVENOUS
  Filled 2015-03-21: qty 150

## 2015-03-21 MED ORDER — SODIUM CHLORIDE 1 G PO TABS
3.0000 g | ORAL_TABLET | Freq: Three times a day (TID) | ORAL | Status: DC
  Administered 2015-03-22 (×2): 3 g via ORAL
  Filled 2015-03-21 (×4): qty 3

## 2015-03-21 MED ORDER — FUROSEMIDE 10 MG/ML IJ SOLN
40.0000 mg | Freq: Every day | INTRAMUSCULAR | Status: DC
  Administered 2015-03-22 – 2015-03-24 (×3): 40 mg via INTRAVENOUS
  Filled 2015-03-21 (×3): qty 4

## 2015-03-21 NOTE — Progress Notes (Signed)
CRITICAL VALUE ALERT  Critical value received: Sodium 119  Date of notification:  03/21/15  Time of notification:  [id number]  Critical value read back:yes  Nurse who received alert:  Arman Bogusorothy Tryone Kille RN   MD notified (1st page):  MD informed of pervious low level and intervention are in place

## 2015-03-21 NOTE — Progress Notes (Signed)
TEAM 1 - Stepdown/ICU TEAM Progress Note  Richard Collier UJW:[id number] DOB: 07/01/1941 DOA: 03/19/2015 PCP: Almedia Balls, MD  Admit HPI / Brief Narrative: 74 y.o. year WM PMHx NIDDM, HTN, HLD, GERD  Presenting with hypontremia, dyspnea, abd pain. Pt noted to have been admitted 5/3-5/4 for acute resp failure w/ hypoxia in setting of asthma exacerbation. Pt was discharged from hospital w/ rx for prednisone and levaquin. Pt reports compliance w/ medication. Pt however, is an extremely poor historian. Pt is unable to give me an exact reason for coming to the hospital. "Lavenia Atlas been stupid. Your doing a good job. Youre doing exactly what youre supposed to do". Pt cannot specify what was initial reason for coming in to the hospital. Pt does admit to SOB. No fevers or chills. L sided nonspecific CP-resolved. + generalized abd pain-though pt denies fevers, chills, nausea, vomiting, diarrhea. Does admit to worsening LE swelling since leaving the hospital. Denies any excess water intake. Denies ETOH abuse.  Presented to ER T 98.8, HR 90s-100s, resp 10s-20s, BP 130s-160s, Satting mid 80s on RA, mid 90s on 2L [place]. WBC 25.7, hgb 11.6, na 119 (na 130 on 5/3), Cr 1.36. BNP 36. CXR w/ persistent mild interstitial prominence in lung bases w/p focal consolidation  HPI/Subjective: 5/8 review of patient's EMR shows that he was discharged on 5/4 with 5 tablets of levofloxacin in order to complete his treatment for pneumonia, appears patient has not completed course of treatment (stop taking when?)   Assessment/Plan: Acute mental status change -Multifactorial to include hyponatremia, acute respiratory failure with hypoxia, secondary to COPD exacerbation -Resolved  Hyponatremia (acute); baseline~130 -Multifactorial to include partially treated COPD exacerbation, hypervolemia -2+ pitting edema in LLEs  -Doppler ultrasound lower extremities negative DVT/SVT -Urine osmolality low 324  -Serum osmolality low 253 -Uric  acid normal -check FeNa, urine sodium, urine/plasma osm -Echocardiogram; diastolic dysfunction mild    -TSH; within normal limit -low dose diuretic to assess for response  -Findings not consistent with SIADH -Continue Salt tablets 3 gm QAC -Consider maintain patient until sodium= 125  Acute resp failure w/ hypoxia/dyspnea/COPD exacerbation -Continue levofloxacin and complete 7 day course   -supplemental O2 to maintain SPO2 89-93% -Flutter valve -Solu-Medrol 60 mg daily - DuoNeb QID -Ambulatory SPO2  Abd Pain  -Resolved -CT abd and pelvis; initial showed distended bladder with mild hydronephrosis  -Strict in and out; since admission -3.4 L  HTN -stable -Continue lisinopril 10 mg daily -Creatinine beginning to trend up decrease to Lasix 40 mg daily   Hypothyroidism -TSH; within normal limit -cont synthroid 50 g daily  Acute on chronic renal failure (baselineCr~1.23-1.36) -Stable, but slightly elevated continue to monitor. -See HTN   Diabetes type 2 controlled -5/6 hemoglobin A1c = 6.4  -Continue sensitive SSI  Leukocytosis -Most likely multifactorial to include partially treated COPD exacerbation, and glucocorticoid use -Continue to titrate steroids down -Continues to be afebrile, however mild left shift, and bands  -CXR w/o infiltrate -UA bland  -pan culture pending    Code Status: FULL Family Communication: no family present at time of exam Disposition Plan: Resolution acute mental status change    Consultants: NA  Procedure/Significant Events: 5/6 echocardiogram;- Left ventricle: mild focal basal hypertrophy of the septum.  -LVEF= 60% to 65%.-(grade 1 diastolic dysfunction). -Aortic root: mildly dilated. 5/6 CT chest without contrast; Diffuse emphysematous changes 5/6 CT abdomen/pelvis w/o contrast; Over distended bladder; mild hydronephrosis Rt> Lt   5/7 bilateral lower extremity Doppler; negative DVT/SVT 5/7 VQ scan; low probability  PE   Culture 5/6 blood left antecubital/right hand NGTD 5/6 urine pending   Antibiotics: Levofloxacin 5/6>>  DVT prophylaxis: Heparin subcutaneous   Devices    LINES / TUBES:      Continuous Infusions:   Objective: VITAL SIGNS: Temp: 98.3 F (36.8 C) (05/08 1202) Temp Source: Oral (05/08 1202) BP: 92/61 mmHg (05/08 1615) Pulse Rate: 84 (05/08 1615) SPO2; FIO2:   Intake/Output Summary (Last 24 hours) at 03/21/15 1647 Last data filed at 03/21/15 1300  Gross per 24 hour  Intake   1040 ml  Output   2600 ml  Net  -1560 ml     Exam: General: A/O 4, NAD, No acute respiratory distress Lungs: Clear to auscultation bilaterally with mild expiratory wheezing,  Cardiovascular: Regular rate and rhythm without murmur gallop or rub normal S1 and S2 Abdomen: Nontender, nondistended, soft, bowel sounds positive, no rebound, no ascites, no appreciable mass Extremities: No significant cyanosis, clubbing, bilateral pedal edema 2+ Lt>>Rt, left lower extremity painful to palpation breath (improving). Left knee no longer hot to touch, erythema resolving    Data Reviewed: Basic Metabolic Panel:  Recent Labs Lab 03/20/15 2137 03/21/15 0130 03/21/15 0615 03/21/15 0821 03/21/15 1235  NA 113* 118* 118* 119* 119*  K 3.5 3.6 3.4* 3.3* 3.7  CL 83* 83* 84* 84* 84*  CO2 23 24 23 22 24   GLUCOSE 134* 110* 100* 103* 157*  BUN 30* 29* 28* 28* 29*  CREATININE 1.67* 1.57* 1.46* 1.51* 1.58*  CALCIUM 7.9* 8.1* 8.3* 8.4* 8.1*  MG  --   --  1.9  --   --    Liver Function Tests:  Recent Labs Lab 03/20/15 2137 03/21/15 0130 03/21/15 0615 03/21/15 0821 03/21/15 1235  AST 35 33 35 34 31  ALT 28 28 28 27 27   ALKPHOS 58 57 55 59 57  BILITOT 0.6 0.6 0.6 0.7 0.6  PROT 5.7* 5.7* 5.6* 5.9* 5.8*  ALBUMIN 2.8* 2.8* 2.9* 3.0* 2.9*   No results for input(s): LIPASE, AMYLASE in the last 168 hours. No results for input(s): AMMONIA in the last 168 hours. CBC:  Recent Labs Lab  03/16/15 1825 03/17/15 0630 03/19/15 0830 03/19/15 1552 03/20/15 0255 03/21/15 0615  WBC 14.4* 16.4* 25.7* 19.4* 23.9* 25.1*  NEUTROABS 10.8*  --  20.8*  --  17.7* 18.1*  HGB 12.6* 11.9* 11.6* 10.9* 11.3* 10.8*  HCT 36.5* 34.4* 32.3* 31.1* 31.6* 30.4*  MCV 88.2 86.2 84.8 84.1 82.9 83.5  PLT 200 209 318 272 307 258   Cardiac Enzymes:  Recent Labs Lab 03/19/15 0830  TROPONINI <0.03   BNP (last 3 results)  Recent Labs  03/17/15 0630 03/19/15 0830  BNP 56.6 32.9    ProBNP (last 3 results) No results for input(s): PROBNP in the last 8760 hours.  CBG:  Recent Labs Lab 03/20/15 1701 03/20/15 2006 03/21/15 0036 03/21/15 0421 03/21/15 1202  GLUCAP 186* 176* 101* 112* 152*    Recent Results (from the past 240 hour(s))  MRSA PCR Screening     Status: None   Collection Time: 03/17/15  3:06 AM  Result Value Ref Range Status   MRSA by PCR NEGATIVE NEGATIVE Final    Comment:        The GeneXpert MRSA Assay (FDA approved for NASAL specimens only), is one component of a comprehensive MRSA colonization surveillance program. It is not intended to diagnose MRSA infection nor to guide or monitor treatment for MRSA infections.   Culture, blood (routine x 2)  Status: None (Preliminary result)   Collection Time: 03/19/15  4:14 PM  Result Value Ref Range Status   Specimen Description BLOOD LEFT ANTECUBITAL  Final   Special Requests BOTTLES DRAWN AEROBIC ONLY 7CC  Final   Culture   Final           BLOOD CULTURE RECEIVED NO GROWTH TO DATE CULTURE WILL BE HELD FOR 5 DAYS BEFORE ISSUING A FINAL NEGATIVE REPORT [facility]formed [facility]    Report Status PENDING  Incomplete  Culture, blood (routine x 2)     Status: None (Preliminary result)   Collection Time: 03/19/15  4:20 PM  Result Value Ref Range Status   Specimen Description BLOOD BLOOD RIGHT HAND  Final   Special Requests BOTTLES DRAWN AEROBIC ONLY 8CC  Final   Culture   Final           BLOOD CULTURE  RECEIVED NO GROWTH TO DATE CULTURE WILL BE HELD FOR 5 DAYS BEFORE ISSUING A FINAL NEGATIVE REPORT Performed at [facility]    Report Status PENDING  Incomplete  Culture, Urine     Status: None   Collection Time: 03/19/15  7:06 PM  Result Value Ref Range Status   Specimen Description URINE, RANDOM  Final   Special Requests NONE  Final   Colony Count NO GROWTH Performed at [facility]   Final   Culture NO GROWTH Performed at [facility]   Final   Report Status 03/20/2015 FINAL  Final     Studies:  Recent x-ray studies have been reviewed in detail by the Attending Physician  Scheduled Meds:  Scheduled Meds: . escitalopram  10 mg Oral Daily  . furosemide  40 mg Intravenous Q12H  . heparin  5,000 Units Subcutaneous 3 times per day  . insulin aspart  0-9 Units Subcutaneous 6 times per day  . ipratropium-albuterol  3 mL Nebulization TID  . [START ON 03/22/2015] levofloxacin (LEVAQUIN) IV  750 mg Intravenous Q48H  . levothyroxine  50 mcg Oral QAC breakfast  . lisinopril  10 mg Oral Daily  . methylPREDNISolone (SOLU-MEDROL) injection  60 mg Intravenous Q24H  . mometasone-formoterol  2 puff Inhalation BID  . pantoprazole  80 mg Oral Daily  . pravastatin  20 mg Oral q1800  . sodium chloride  3 mL Intravenous Q12H  . sodium chloride  2 g Oral TID WC    Time spent on care of this patient: 40 mins   Rashena Dowling, Roselind MessierURTIS J , MD  [facility] Office  [phone] Pager - [phone]  On-Call/Text Page:      Loretha [email or web address]      password [email or web address]  If 7PM-7AM, please contact night-coverage [email or web address] Password [email or web address] 03/21/2015, 4:47 PM   LOS: 2 days   Care during the described time interval was provided by me .  I have reviewed this patient's available data, including medical history, events of note, physical examination, radiology studies and test results as part of my evaluation  Carolyne Littlesurtis Quanetta Truss, MD [phone] Pager

## 2015-03-21 NOTE — Progress Notes (Signed)
CRITICAL VALUE ALERT  Critical value received:  Na - 118  Date of notification:  03/21/2015  Time of notification:  0243  Critical value read back: yes   Nurse who received alert:  Rosamaria LintsKelsey Chance Karam  MD notified (1st page):  Kirtland BouchardK. Schorr  Time of first page:  0245  MD notified (2nd page):  Time of second page:  Responding MD:  Merdis DelayK. Schorr  Time MD responded:  Did not respond, Na has been low and interventions are in place and being monitored

## 2015-03-21 NOTE — Progress Notes (Signed)
At 2115 pt was ambulated in the hall.  RN monitored O2 level with oxygen at 0 to 2L.  While ambulating the oxygen waveform was not uniform and was hard to know for sure what pts O2 sats were.  On 0L patient was satting from 85-87% and at one point with a good waveform it was reading 77%.  As soon as O2 is turned onto 2L oxygen bumped back up to 94%. Pt had no SOB, dizziness, no tachycardia, and stated he felt good the entire time while walking. Pt is now asleep.

## 2015-03-22 ENCOUNTER — Other Ambulatory Visit (HOSPITAL_COMMUNITY): Payer: Medicare Other

## 2015-03-22 ENCOUNTER — Ambulatory Visit (HOSPITAL_COMMUNITY): Payer: Medicare Other

## 2015-03-22 LAB — CBC WITH DIFFERENTIAL/PLATELET
Basophils Absolute: 0.3 10*3/uL — ABNORMAL HIGH (ref 0.0–0.1)
Basophils Relative: 1 % (ref 0–1)
Eosinophils Absolute: 0 10*3/uL (ref 0.0–0.7)
Eosinophils Relative: 0 % (ref 0–5)
HCT: 30.6 % — ABNORMAL LOW (ref 39.0–52.0)
Hemoglobin: 11.1 g/dL — ABNORMAL LOW (ref 13.0–17.0)
Lymphocytes Relative: 18 % (ref 12–46)
Lymphs Abs: 5.4 10*3/uL — ABNORMAL HIGH (ref 0.7–4.0)
MCH: 30.7 pg (ref 26.0–34.0)
MCHC: 36.3 g/dL — ABNORMAL HIGH (ref 30.0–36.0)
MCV: 84.8 fL (ref 78.0–100.0)
Monocytes Absolute: 2.4 10*3/uL — ABNORMAL HIGH (ref 0.1–1.0)
Monocytes Relative: 8 % (ref 3–12)
Neutro Abs: 21.7 10*3/uL — ABNORMAL HIGH (ref 1.7–7.7)
Neutrophils Relative %: 73 % (ref 43–77)
Platelets: 303 10*3/uL (ref 150–400)
RBC: 3.61 MIL/uL — ABNORMAL LOW (ref 4.22–5.81)
RDW: 14.1 % (ref 11.5–15.5)
WBC: 29.8 10*3/uL — ABNORMAL HIGH (ref 4.0–10.5)

## 2015-03-22 LAB — GLUCOSE, CAPILLARY
GLUCOSE-CAPILLARY: 189 mg/dL — AB (ref 70–99)
Glucose-Capillary: 106 mg/dL — ABNORMAL HIGH (ref 70–99)
Glucose-Capillary: 109 mg/dL — ABNORMAL HIGH (ref 70–99)
Glucose-Capillary: 118 mg/dL — ABNORMAL HIGH (ref 70–99)
Glucose-Capillary: 137 mg/dL — ABNORMAL HIGH (ref 70–99)
Glucose-Capillary: 139 mg/dL — ABNORMAL HIGH (ref 70–99)
Glucose-Capillary: 167 mg/dL — ABNORMAL HIGH (ref 70–99)

## 2015-03-22 LAB — MAGNESIUM: Magnesium: 2.1 mg/dL (ref 1.7–2.4)

## 2015-03-22 MED ORDER — SODIUM CHLORIDE 0.9 % IV BOLUS (SEPSIS)
500.0000 mL | Freq: Once | INTRAVENOUS | Status: AC
  Administered 2015-03-22: 500 mL via INTRAVENOUS

## 2015-03-22 MED ORDER — INSULIN ASPART 100 UNIT/ML ~~LOC~~ SOLN: 0.0000 [IU] | Freq: Three times a day (TID) | SUBCUTANEOUS | Status: DC

## 2015-03-22 NOTE — Progress Notes (Signed)
[place] TEAM 1 - Stepdown/ICU TEAM Progress Note  Dorian PodRay Kunesh ZOX:[id number] DOB: [birth year] DOA: 03/19/2015 PCP: Almedia BallsKELLY,SAM, MD  Admit HPI / Brief Narrative: 74 y.o. year M Hx DM, HTN, HLD, and GERD who presented with hyponatremia, dyspnea, and abd pain. Pt was admitted 5/3-5/4 for acute resp failure w/ hypoxia in setting of asthma exacerbation. Pt was discharged from hospital w/ rx for prednisone and levaquin.   In the ER he was found to have a Na of 119, and was confused. CXR w/ persistent mild interstitial prominence in lung bases w/o focal consolidation.  HPI/Subjective: The patient is awake alert and oriented though he does seem very mildly confused.  He complains of some dizziness when standing and it is noted he has a systolic blood pressure of 85 at the time of the physician visit.  He denies chest pain fevers chills nausea vomiting or shortness of breath.  Assessment/Plan:  Acute mental status change -Multifactorial to include hyponatremia, acute respiratory failure with hypoxia secondary to COPD exacerbation -Resolving  Acute Hyponatremia - idiopathic -baseline ~130 -Though urine osmolality is low it is greater than 300 and studies are otherwise consistent with possible SIADH, but patient is on HCTZ and this can certainly confuse the test results  -Continuing fluid restriction will be challenging now patient is hypotensive - give modest volume via bolus and follow -Stop Lexapro -TSH within normal limit  Hypotension -Discontinue lisinopril and follow - cortisol level will likely not be helpful at present as the patient has been on steroids but will check in a.m. and if this is significantly diminished could be helpful  Acute resp failure w/ hypoxia / COPD exacerbation -Continue levofloxacin to complete 7 day course   -supplemental O2 to maintain SPO2 89-93% -Well compensated today - stop steroids and follow  Abd Pain  -Resolved -CT abd and pelvis; initial showed  distended bladder with mild hydronephrosis   Hypothyroidism -TSH within normal limit -cont synthroid 50 g daily  Acute on chronic renal failure (baselineCr 1.23-1.36) -cer slightly elevated - continue to monitor  Diabetes type 2 controlled -5/6 A1c 6.4  -Continue sensitive SSI  Code Status: FULL Family Communication: no family present at time of exam Disposition Plan: SDU  Consultants: NA  Procedure/Significant Events: 5/6 echocardiogram;- Left ventricle: mild focal basal hypertrophy of the septum. -LVEF= 60% to 65%.-(grade 1 diastolic dysfunction). -Aortic root: mildly dilated. 5/6 CT chest without contrast; Diffuse emphysematous changes 5/6 CT abdomen/pelvis w/o contrast; Over distended bladder; mild hydronephrosis Rt> Lt  5/7 bilateral lower extremity Doppler; negative DVT/SVT 5/7 VQ scan; low probability PE  Antibiotics: Levofloxacin 5/6>>  DVT prophylaxis: Heparin subcutaneous  Objective: Blood pressure 90/59, pulse 90, temperature 98.2 F (36.8 C), temperature source Oral, resp. rate 18, height 5\' 11"  (1.803 m), weight 87.544 kg (193 lb), SpO2 91 %.  Intake/Output Summary (Last 24 hours) at 03/22/15 1636 Last data filed at 03/22/15 1200  Gross per 24 hour  Intake      0 ml  Output   2300 ml  Net  -2300 ml   Exam: General: No acute respiratory distress Lungs: Clear to auscultation bilaterally without wheezing or focal crackles Cardiovascular: Regular rate and rhythm without murmur gallop or rub Abdomen: Nontender, nondistended, soft, bowel sounds positive, no rebound, no ascites, no appreciable mass Extremities: No significant cyanosis, clubbing, bilateral pedal edema 1+    Data Reviewed: Basic Metabolic Panel:  Recent Labs Lab 03/21/15 0130 03/21/15 0615 03/21/15 0821 03/21/15 1235 03/21/15 2106 03/22/15 0313  NA 118*  118* 119* 119* 123*  --   K 3.6 3.4* 3.3* 3.7 3.7  --   CL 83* 84* 84* 84* 85*  --   CO2 24 23 22 24 25   --   GLUCOSE 110*  100* 103* 157* 158*  --   BUN 29* 28* 28* 29* 31*  --   CREATININE 1.57* 1.46* 1.51* 1.58* 1.60*  --   CALCIUM 8.1* 8.3* 8.4* 8.1* 8.7*  --   MG  --  1.9  --   --   --  2.1   Liver Function Tests:  Recent Labs Lab 03/21/15 0130 03/21/15 0615 03/21/15 0821 03/21/15 1235 03/21/15 2106  AST 33 35 34 31 31  ALT 28 28 27 27 29   ALKPHOS 57 55 59 57 65  BILITOT 0.6 0.6 0.7 0.6 0.6  PROT 5.7* 5.6* 5.9* 5.8* 6.8  ALBUMIN 2.8* 2.9* 3.0* 2.9* 3.2*   CBC:  Recent Labs Lab 03/16/15 1825  03/19/15 0830 03/19/15 1552 03/20/15 0255 03/21/15 0615 03/22/15 0313  WBC 14.4*  < > 25.7* 19.4* 23.9* 25.1* 29.8*  NEUTROABS 10.8*  --  20.8*  --  17.7* 18.1* 21.7*  HGB 12.6*  < > 11.6* 10.9* 11.3* 10.8* 11.1*  HCT 36.5*  < > 32.3* 31.1* 31.6* 30.4* 30.6*  MCV 88.2  < > 84.8 84.1 82.9 83.5 84.8  PLT 200  < > 318 272 307 258 303  < > = values in this interval not displayed.   Cardiac Enzymes:  Recent Labs Lab 03/19/15 0830  TROPONINI <0.03   CBG:  Recent Labs Lab 03/22/15 0021 03/22/15 0436 03/22/15 0743 03/22/15 1207 03/22/15 1530  GLUCAP 106* 109* 118* 139* 189*    Recent Results (from the past 240 hour(s))  MRSA PCR Screening     Status: None   Collection Time: 03/17/15  3:06 AM  Result Value Ref Range Status   MRSA by PCR NEGATIVE NEGATIVE Final    Comment:        The GeneXpert MRSA Assay (FDA approved for NASAL specimens only), is one component of a comprehensive MRSA colonization surveillance program. It is not intended to diagnose MRSA infection nor to guide or monitor treatment for MRSA infections.   Culture, blood (routine x 2)     Status: None (Preliminary result)   Collection Time: 03/19/15  4:14 PM  Result Value Ref Range Status   Specimen Description BLOOD LEFT ANTECUBITAL  Final   Special Requests BOTTLES DRAWN AEROBIC ONLY 7CC  Final   Culture   Final           BLOOD CULTURE RECEIVED NO GROWTH TO DATE CULTURE WILL BE HELD FOR 5 DAYS BEFORE ISSUING A  FINAL NEGATIVE REPORT Performed at [facility]    Report Status PENDING  Incomplete  Culture, blood (routine x 2)     Status: None (Preliminary result)   Collection Time: 03/19/15  4:20 PM  Result Value Ref Range Status   Specimen Description BLOOD BLOOD RIGHT HAND  Final   Special Requests BOTTLES DRAWN AEROBIC ONLY 8CC  Final   Culture   Final           BLOOD CULTURE RECEIVED NO GROWTH TO DATE CULTURE WILL BE HELD FOR 5 DAYS BEFORE ISSUING A FINAL NEGATIVE REPORT Performed at [facility]    Report Status PENDING  Incomplete  Culture, Urine     Status: None   Collection Time: 03/19/15  7:06 PM  Result Value Ref Range Status  Specimen Description URINE, RANDOM  Final   Special Requests NONE  Final   Colony Count NO GROWTH Performed at [facility]   Final   Culture NO GROWTH Performed at [facility]   Final   Report Status 03/20/2015 FINAL  Final     Studies:  Recent x-ray studies have been reviewed in detail by the Attending Physician  Scheduled Meds:  Scheduled Meds: . escitalopram  10 mg Oral Daily  . furosemide  40 mg Intravenous Daily  . heparin  5,000 Units Subcutaneous 3 times per day  . insulin aspart  0-9 Units Subcutaneous 6 times per day  . ipratropium-albuterol  3 mL Nebulization TID  . levofloxacin (LEVAQUIN) IV  750 mg Intravenous Q48H  . levothyroxine  50 mcg Oral QAC breakfast  . lisinopril  10 mg Oral Daily  . methylPREDNISolone (SOLU-MEDROL) injection  60 mg Intravenous Q24H  . mometasone-formoterol  2 puff Inhalation BID  . pantoprazole  80 mg Oral Daily  . pravastatin  20 mg Oral q1800  . sodium chloride  3 mL Intravenous Q12H  . sodium chloride  3 g Oral TID WC    Time spent on care of this patient: 35 mins  Lonia Blood, MD [facility] For Consults/Admissions - Flow Manager - [phone] Office  [phone]  Contact MD directly via text page:      [email or web address]      password  [facility]  03/22/2015, 4:36 PM   LOS: 3 days

## 2015-03-22 NOTE — Plan of Care (Signed)
Problem: Phase I Progression Outcomes Goal: OOB as tolerated unless otherwise ordered Outcome: Completed/Met Date Met:  03/22/15 Pt ambulates well with stand-by assist. No SOB or dizziness.

## 2015-03-23 DIAGNOSIS — J9601 Acute respiratory failure with hypoxia: Secondary | ICD-10-CM

## 2015-03-23 DIAGNOSIS — I9589 Other hypotension: Secondary | ICD-10-CM

## 2015-03-23 LAB — CORTISOL: Cortisol, Plasma: 6.5 ug/dL

## 2015-03-23 LAB — CBC
HCT: 30.7 % — ABNORMAL LOW (ref 39.0–52.0)
Hemoglobin: 10.6 g/dL — ABNORMAL LOW (ref 13.0–17.0)
MCH: 29.8 pg (ref 26.0–34.0)
MCHC: 34.5 g/dL (ref 30.0–36.0)
MCV: 86.2 fL (ref 78.0–100.0)
Platelets: 335 10*3/uL (ref 150–400)
RBC: 3.56 MIL/uL — AB (ref 4.22–5.81)
RDW: 14.4 % (ref 11.5–15.5)
WBC: 28.8 10*3/uL — ABNORMAL HIGH (ref 4.0–10.5)

## 2015-03-23 LAB — BASIC METABOLIC PANEL
ANION GAP: 11 (ref 5–15)
BUN: 37 mg/dL — ABNORMAL HIGH (ref 6–20)
CHLORIDE: 92 mmol/L — AB (ref 101–111)
CO2: 24 mmol/L (ref 22–32)
Calcium: 8.4 mg/dL — ABNORMAL LOW (ref 8.9–10.3)
Creatinine, Ser: 1.52 mg/dL — ABNORMAL HIGH (ref 0.61–1.24)
GFR calc Af Amer: 51 mL/min — ABNORMAL LOW (ref >60)
GFR calc non Af Amer: 44 mL/min — ABNORMAL LOW (ref >60)
Glucose, Bld: 103 mg/dL — ABNORMAL HIGH (ref 70–99)
POTASSIUM: 3.7 mmol/L (ref 3.5–5.1)
Sodium: 127 mmol/L — ABNORMAL LOW (ref 135–145)

## 2015-03-23 LAB — GLUCOSE, CAPILLARY
GLUCOSE-CAPILLARY: 118 mg/dL — AB (ref 70–99)
Glucose-Capillary: 104 mg/dL — ABNORMAL HIGH (ref 70–99)
Glucose-Capillary: 122 mg/dL — ABNORMAL HIGH (ref 70–99)

## 2015-03-23 MED ORDER — LEVOFLOXACIN 500 MG PO TABS
500.0000 mg | ORAL_TABLET | Freq: Every day | ORAL | Status: DC
  Administered 2015-03-23: 500 mg via ORAL
  Filled 2015-03-23 (×2): qty 1

## 2015-03-23 MED ORDER — IPRATROPIUM-ALBUTEROL 0.5-2.5 (3) MG/3ML IN SOLN
RESPIRATORY_TRACT | Status: AC
  Administered 2015-03-23: 3 mL
  Filled 2015-03-23: qty 3

## 2015-03-23 MED ORDER — LEVOFLOXACIN 750 MG PO TABS: 750.0000 mg | ORAL_TABLET | ORAL | Status: DC

## 2015-03-23 NOTE — Progress Notes (Signed)
Pt sitting in chair with no complaints. Turned pt's oxygen off and his saturations dropped to 86% on RA at rest. Turned oxygen back on at 2 liters and pt's oxygen went back up to 93%. Will continue to monitor.

## 2015-03-23 NOTE — Progress Notes (Signed)
[facility] TEAM 1 - Stepdown/ICU TEAM Progress Note  Dorian PodRay Welliver BMW:[id number] DOB: 03/28/1941 DOA: 03/19/2015 PCP: Almedia BallsKELLY,SAM, MD  Admit HPI / Brief Narrative58: 73 y.o. year WM PMHx NIDDM, HTN, HLD, GERD  Presenting with hypontremia, dyspnea, abd pain. Pt noted to have been admitted 5/3-5/4 for acute resp failure w/ hypoxia in setting of asthma exacerbation. Pt was discharged from hospital w/ rx for prednisone and levaquin. Pt reports compliance w/ medication. Pt however, is an extremely poor historian. Pt is unable to give me an exact reason for coming to the hospital. "Lavenia Atlasve been stupid. Your doing a good job. Youre doing exactly what youre supposed to do". Pt cannot specify what was initial reason for coming in to the hospital. Pt does admit to SOB. No fevers or chills. L sided nonspecific CP-resolved. + generalized abd pain-though pt denies fevers, chills, nausea, vomiting, diarrhea. Does admit to worsening LE swelling since leaving the hospital. Denies any excess water intake. Denies ETOH abuse.  Presented to ER T 98.8, HR 90s-100s, resp 10s-20s, BP 130s-160s, Satting mid 80s on RA, mid 90s on 2L [place]. WBC 25.7, hgb 11.6, na 119 (na 130 on 5/3), Cr 1.36. BNP 36. CXR w/ persistent mild interstitial prominence in lung bases w/p focal consolidation  HPI/Subjective: 5/10 review of patient's EMR shows that he was discharged on 5/4 with 5 tablets of levofloxacin in order to complete his treatment for pneumonia, appears patient has not completed course of treatment (stop taking when?). Patient also states not on home O2 but becomes dyspneic when oxygen decreased currently on 2 L O2 via [place].   Assessment/Plan: Acute mental status change (disorientation) -Multifactorial to include hyponatremia, acute respiratory failure with hypoxia, chronic respiratory failure?, secondary to COPD exacerbation -Resolved  Hyponatremia (acute); baseline~130 -Multifactorial to include partially treated COPD exacerbation,  chronic respiratory failure?, hypervolemia -Doppler ultrasound lower extremities negative DVT/SVT -Urine osmolality low 324  -Serum osmolality low 253 -Uric acid normal -BUN high -Echocardiogram; diastolic dysfunction mild    -TSH; within normal limit -Findings not consistent with SIADH -Consider discharge in a.m. if sodium> 125  Acute resp failure w/ hypoxia/dyspnea/COPD exacerbation/chronic respiratory failure? -Continue levofloxacin and complete 7 day course   -supplemental O2 to maintain SPO2 89-93% - DuoNeb TID -Ambulatory SPO2; patient most likely needed O2 in the past but was never officially diagnosed with need. Whenever O2 turned off patient SPO2 immediately drops into the mid 80s. -Ambulatory SPO2, to set up home O2   Abd Pain  -CT abd and pelvis; initial showed distended bladder with mild hydronephrosis; no pain.  -Strict in and out; since admission -7.5 L  HTN/Hypotension -stable -Patient's BP medication stopped yesterday, BP has increased but still low for a 74 year old  -Creatinine stabilized; continue Lasix 40 mg daily  -A.m. cortisol mildly depressed but would be consistent with patient's large dose steroids for his COPD exacerbation  Hypothyroidism -TSH; within normal limit -cont synthroid 50 g daily  Acute on chronic renal failure (baselineCr~1.23-1.36) -Stable, but slightly elevated continue to monitor. -See HTN   Diabetes type 2 controlled -5/6 hemoglobin A1c = 6.4  -Continue sensitive SSI  Leukocytosis -Most likely multifactorial to include partially treated COPD exacerbation, and glucocorticoid use -Continues to be afebrile -Panculture negative    Code Status: FULL Family Communication: no family present at time of exam Disposition Plan: Resolution acute mental status change    Consultants: NA  Procedure/Significant Events: 5/6 echocardiogram;- Left ventricle: mild focal basal hypertrophy of the septum.  -LVEF= 60% to  65%.-(grade  1 diastolic dysfunction). -Aortic root: mildly dilated. 5/6 CT chest without contrast; Diffuse emphysematous changes 5/6 CT abdomen/pelvis w/o contrast; Over distended bladder; mild hydronephrosis Rt> Lt   5/7 bilateral lower extremity Doppler; negative DVT/SVT 5/7 VQ scan; low probability PE   Culture 5/6 blood left antecubital/right NGTD 5/4 MRSA by PCA negative 5/6 urine negative   Antibiotics: Levofloxacin 5/6>>  DVT prophylaxis: Heparin subcutaneous   Devices    LINES / TUBES:      Continuous Infusions:   Objective: VITAL SIGNS: Temp: 97.8 F (36.6 C) (05/10 2025) Temp Source: Oral (05/10 2025) BP: 95/65 mmHg (05/10 2025) Pulse Rate: 96 (05/10 2025) SPO2; FIO2:   Intake/Output Summary (Last 24 hours) at 03/23/15 2205 Last data filed at 03/23/15 2109  Gross per 24 hour  Intake    706 ml  Output   2100 ml  Net  -1394 ml     Exam: General: A/O 4, NAD, No acute respiratory distress Lungs: Clear to auscultation bilaterally with decreased breath sounds diffusely, negative wheezing or crackles,  Cardiovascular: Regular rate and rhythm without murmur gallop or rub normal S1 and S2 Abdomen: Nontender, nondistended, soft, bowel sounds positive, no rebound, no ascites, no appreciable mass Extremities: No significant cyanosis, clubbing, bilateral pedal edema 1+ Lt>>Rt, left lower extremity no longer painful to palpation    Data Reviewed: Basic Metabolic Panel:  Recent Labs Lab 03/21/15 0615 03/21/15 0821 03/21/15 1235 03/21/15 2106 03/22/15 0313 03/23/15 0412  NA 118* 119* 119* 123*  --  127*  K 3.4* 3.3* 3.7 3.7  --  3.7  CL 84* 84* 84* 85*  --  92*  CO2 --  24  GLUCOSE 100* 103* 157* 158*  --  103*  BUN 28* 28* 29* 31*  --  37*  CREATININE 1.46* 1.51* 1.58* 1.60*  --  1.52*  CALCIUM 8.3* 8.4* 8.1* 8.7*  --  8.4*  MG 1.9  --   --   --  2.1  --    Liver Function Tests:  Recent Labs Lab 03/21/15 0130 03/21/15 0615  03/21/15 0821 03/21/15 1235 03/21/15 2106  AST 33 35 34 31 31  ALT ALKPHOS 57 55 59 57 65  BILITOT 0.6 0.6 0.7 0.6 0.6  PROT 5.7* 5.6* 5.9* 5.8* 6.8  ALBUMIN 2.8* 2.9* 3.0* 2.9* 3.2*   No results for input(s): LIPASE, AMYLASE in the last 168 hours. No results for input(s): AMMONIA in the last 168 hours. CBC:  Recent Labs Lab 03/19/15 0830 03/19/15 1552 03/20/15 0255 03/21/15 0615 03/22/15 0313 03/23/15 0412  WBC 25.7* 19.4* 23.9* 25.1* 29.8* 28.8*  NEUTROABS 20.8*  --  17.7* 18.1* 21.7*  --   HGB 11.6* 10.9* 11.3* 10.8* 11.1* 10.6*  HCT 32.3* 31.1* 31.6* 30.4* 30.6* 30.7*  MCV 84.8 84.1 82.9 83.5 84.8 86.2  PLT 318 272 307 258 303 335   Cardiac Enzymes:  Recent Labs Lab 03/19/15 0830  TROPONINI <0.03   BNP (last 3 results)  Recent Labs  03/17/15 0630 03/19/15 0830  BNP 56.6 32.9    ProBNP (last 3 results) No results for input(s): PROBNP in the last 8760 hours.  CBG:  Recent Labs Lab 03/22/15 1530 03/22/15 2143 03/23/15 0823 03/23/15 1138 03/23/15 1603  GLUCAP 189* 137* 104* 122* 118*    Recent Results (from the past 240 hour(s))  MRSA PCR Screening     Status: None   Collection Time: 03/17/15  3:06 AM  Result Value Ref Range Status   MRSA by PCR NEGATIVE NEGATIVE Final    Comment:        The GeneXpert MRSA Assay (FDA approved for NASAL specimens only), is one component of a comprehensive MRSA colonization surveillance program. It is not intended to diagnose MRSA infection nor to guide or monitor treatment for MRSA infections.   Culture, blood (routine x 2)     Status: None (Preliminary result)   Collection Time: 03/19/15  4:14 PM  Result Value Ref Range Status   Specimen Description BLOOD LEFT ANTECUBITAL  Final   Special Requests BOTTLES DRAWN AEROBIC ONLY 7CC  Final   Culture   Final           BLOOD CULTURE RECEIVED NO GROWTH TO DATE CULTURE WILL BE HELD FOR 5 DAYS BEFORE ISSUING A FINAL NEGATIVE REPORT Performed at  [facility]    Report Status PENDING  Incomplete  Culture, blood (routine x 2)     Status: None (Preliminary result)   Collection Time: 03/19/15  4:20 PM  Result Value Ref Range Status   Specimen Description BLOOD BLOOD RIGHT HAND  Final   Special Requests BOTTLES DRAWN AEROBIC ONLY 8CC  Final   Culture   Final           BLOOD CULTURE RECEIVED NO GROWTH TO DATE CULTURE WILL BE HELD FOR 5 DAYS BEFORE ISSUING A FINAL NEGATIVE REPORT Performed at [facility]    Report Status PENDING  Incomplete  Culture, Urine     Status: None   Collection Time: 03/19/15  7:06 PM  Result Value Ref Range Status   Specimen Description URINE, RANDOM  Final   Special Requests NONE  Final   Colony Count NO GROWTH Performed at [facility]   Final   Culture NO GROWTH Performed at [facility]   Final   Report Status 03/20/2015 FINAL  Final     Studies:  Recent x-ray studies have been reviewed in detail by the Attending Physician  Scheduled Meds:  Scheduled Meds: . furosemide  40 mg Intravenous Daily  . heparin  5,000 Units Subcutaneous 3 times per day  . insulin aspart  0-9 Units Subcutaneous TID WC  . ipratropium-albuterol  3 mL Nebulization TID  . levofloxacin  500 mg Oral q1800  . levothyroxine  50 mcg Oral QAC breakfast  . mometasone-formoterol  2 puff Inhalation BID  . pantoprazole  80 mg Oral Daily  . pravastatin  20 mg Oral q1800  . sodium chloride  3 mL Intravenous Q12H    Time spent on care of this patient: 40 mins   WOODS, Roselind MessierURTIS J , MD  [facility] Office  [phone] Pager [phone]  On-Call/Text Page:      [email or web address] [email or web address]      password [email or web address]  If 7PM-7AM, please contact night-coverage [email or web address] Password [email or web address] 03/23/2015, 10:05 PM   LOS: 4 days   Care during the described time interval was provided by me .  I have reviewed this patient's available data, including medical history, events of note, physical examination,  radiology studies and test results as part of my evaluation  Carolyne Littlesurtis Woods, MD [phone] Pager

## 2015-03-24 LAB — GLUCOSE, CAPILLARY
GLUCOSE-CAPILLARY: 95 mg/dL (ref 70–99)
Glucose-Capillary: 149 mg/dL — ABNORMAL HIGH (ref 70–99)
Glucose-Capillary: 137 mg/dL — ABNORMAL HIGH (ref 70–99)

## 2015-03-24 MED ORDER — ALBUTEROL SULFATE HFA 108 (90 BASE) MCG/ACT IN AERS: 2.0000 | INHALATION_SPRAY | Freq: Four times a day (QID) | RESPIRATORY_TRACT | Status: AC | PRN

## 2015-03-24 MED ORDER — ALBUTEROL SULFATE (2.5 MG/3ML) 0.083% IN NEBU: 2.5000 mg | INHALATION_SOLUTION | RESPIRATORY_TRACT | Status: AC | PRN

## 2015-03-24 MED ORDER — IPRATROPIUM-ALBUTEROL 0.5-2.5 (3) MG/3ML IN SOLN: 3.0000 mL | RESPIRATORY_TRACT | Status: DC | PRN

## 2015-03-24 NOTE — Discharge Instructions (Signed)
Chronic Obstructive Pulmonary Disease °Chronic obstructive pulmonary disease (COPD) is a common lung condition in which airflow from the lungs is limited. COPD is a general term that can be used to describe many different lung problems that limit airflow, including both chronic bronchitis and emphysema.  If you have COPD, your lung function will probably never return to normal, but there are measures you can take to improve lung function and make yourself feel better.  °CAUSES  °· Smoking (common).   °· Exposure to secondhand smoke.   °· Genetic problems. °· Chronic inflammatory lung diseases or recurrent infections. °SYMPTOMS  °· Shortness of breath, especially with physical activity.   °· Deep, persistent (chronic) cough with a large amount of thick mucus.   °· Wheezing.   °· Rapid breaths (tachypnea).   °· Gray or bluish discoloration (cyanosis) of the skin, especially in fingers, toes, or lips.   °· Fatigue.   °· Weight loss.   °· Frequent infections or episodes when breathing symptoms become much worse (exacerbations).   °· Chest tightness. °DIAGNOSIS  °Your health care provider will take a medical history and perform a physical examination to make the initial diagnosis.  Additional tests for COPD may include:  °· Lung (pulmonary) function tests. °· Chest X-ray. °· CT scan. °· Blood tests. °TREATMENT  °Treatment available to help you feel better when you have COPD includes:  °· Inhaler and nebulizer medicines. These help manage the symptoms of COPD and make your breathing more comfortable. °· Supplemental oxygen. Supplemental oxygen is only helpful if you have a low oxygen level in your blood.   °· Exercise and physical activity. These are beneficial for nearly all people with COPD. Some people may also benefit from a pulmonary rehabilitation program. °HOME CARE INSTRUCTIONS  °· Take all medicines (inhaled or pills) as directed by your health care provider. °· Avoid over-the-counter medicines or cough syrups  that dry up your airway (such as antihistamines) and slow down the elimination of secretions unless instructed otherwise by your health care provider.   °· If you are a smoker, the most important thing that you can do is stop smoking. Continuing to smoke will cause further lung damage and breathing trouble. Ask your health care provider for help with quitting smoking. He or she can direct you to community resources or hospitals that provide support. °· Avoid exposure to irritants such as smoke, chemicals, and fumes that aggravate your breathing. °· Use oxygen therapy and pulmonary rehabilitation if directed by your health care provider. If you require home oxygen therapy, ask your health care provider whether you should purchase a pulse oximeter to measure your oxygen level at home.   °· Avoid contact with individuals who have a contagious illness. °· Avoid extreme temperature and humidity changes. °· Eat healthy foods. Eating smaller, more frequent meals and resting before meals may help you maintain your strength. °· Stay active, but balance activity with periods of rest. Exercise and physical activity will help you maintain your ability to do things you want to do. °· Preventing infection and hospitalization is very important when you have COPD. Make sure to receive all the vaccines your health care provider recommends, especially the pneumococcal and influenza vaccines. Ask your health care provider whether you need a pneumonia vaccine. °· Learn and use relaxation techniques to manage stress. °· Learn and use controlled breathing techniques as directed by your health care provider. Controlled breathing techniques include:   °· Pursed lip breathing. Start by breathing in (inhaling) through your nose for 1 second. Then, purse your lips as if you were   going to whistle and breathe out (exhale) through the pursed lips for 2 seconds.   °· Diaphragmatic breathing. Start by putting one hand on your abdomen just above  your waist. Inhale slowly through your nose. The hand on your abdomen should move out. Then purse your lips and exhale slowly. You should be able to feel the hand on your abdomen moving in as you exhale.   °· Learn and use controlled coughing to clear mucus from your lungs. Controlled coughing is a series of short, progressive coughs. The steps of controlled coughing are:   °· Lean your head slightly forward.   °· Breathe in deeply using diaphragmatic breathing.   °· Try to hold your breath for 3 seconds.   °· Keep your mouth slightly open while coughing twice.   °· Spit any mucus out into a tissue.   °· Rest and repeat the steps once or twice as needed. °SEEK MEDICAL CARE IF:  °· You are coughing up more mucus than usual.   °· There is a change in the color or thickness of your mucus.   °· Your breathing is more labored than usual.   °· Your breathing is faster than usual.   °SEEK IMMEDIATE MEDICAL CARE IF:  °· You have shortness of breath while you are resting.   °· You have shortness of breath that prevents you from: °¨ Being able to talk.   °¨ Performing your usual physical activities.   °· You have chest pain lasting longer than 5 minutes.   °· Your skin color is more cyanotic than usual. °· You measure low oxygen saturations for longer than 5 minutes with a pulse oximeter. °MAKE SURE YOU:  °· Understand these instructions. °· Will watch your condition. °· Will get help right away if you are not doing well or get worse. °Document Released: 08/09/2005 Document Revised: 03/16/2014 Document Reviewed: 06/26/2013 °Exit[employer]® Patient Information ©2015 [employer]. This information is not intended to replace advice given to you by your health care provider. Make sure you discuss any questions you have with your health care provider. ° °Oxygen Use at Home °Oxygen can be prescribed for home use. The prescription will show the flow rate. This is how much oxygen is to be used per minute. This will be listed in liters per  minute (LPM or L/M). A liter is a metric measurement of volume. °You will use oxygen therapy as directed. It can be used while exercising, sleeping, or at rest. You may need oxygen continuously. Your health care provider may order a blood oxygen test (arterial blood gas or pulse oximetry test) that will show what your oxygen level is. Your health care provider will use these measurements to learn about your needs and follow your progress. °Home oxygen therapy is commonly used on patients with various lung (pulmonary) related conditions. Some of these conditions include: °· Asthma. °· Lung cancer. °· Pneumonia. °· Emphysema. °· Chronic bronchitis. °· Cystic fibrosis. °· Other lung diseases. °· Pulmonary fibrosis. °· Occupational lung disease. °· Heart failure. °· Chronic obstructive pulmonary disease (COPD). °3 COMMON WAYS OF PROVIDING OXYGEN THERAPY °· Gas: The gas form of oxygen is put into variously sized cylinders or tanks. The cylinders or oxygen tanks contain compressed oxygen. The cylinder is equipped with a regulator that controls the flow rate. Because the flow of oxygen out of the cylinder is constant, an oxygen conserving device may be attached to the system to avoid waste. This device releases the gas only when you inhale and cuts it off when you exhale. Oxygen can be provided in a   small cylinder that can be carried with you. Large tanks are heavy and are only for stationary use. After use, empty tanks must be exchanged for full tanks. °· Liquid: The liquid form of oxygen is put into a container similar to a thermos. When released, the liquid converts to a gas and you breathe it in just like the compressed gas. This storage method takes up less space than the compressed gas cylinder, and you can transfer the liquid to a small, portable vessel at home. Liquid oxygen is more expensive than the compressed gas, and the vessel vents when not in use. An oxygen conserving device may be built into the vessel to  conserve the oxygen. Liquid oxygen is very cold, around 297° below zero. °· Oxygen concentrator: This medical device filters oxygen from room air and gives almost 100% oxygen to the patient. Oxygen concentrators are powered by electricity. Benefits of this system are: °¨ It does not need to be resupplied. °¨ It is not as costly as liquid oxygen. °¨ Extra tubing permits the user to move around easier. °There are several types of small, portable oxygen systems available which can help you remain active and mobile. You must have a cylinder of oxygen as a backup in the event of a power failure. Advise your electric power company that you are on oxygen therapy in order to get priority service when there is a power failure. °OXYGEN DELIVERY DEVICES °There are 3 common ways to deliver oxygen to your body. °· Nasal cannula. This is a 2-pronged device inserted in the nostrils that is connected to tubing carrying the oxygen. The tubing can rest on the ears or be attached to the frame of eyeglasses. °· Mask. People who need a high flow of oxygen generally use a mask. °· Transtracheal catheter. Transtracheal oxygen therapy requires the insertion of a small, flexible tube (catheter) in the windpipe (trachea). This catheter is held in place by a necklace. Since transtracheal oxygen bypasses the mouth, nose, and throat, a humidifier is absolutely required at flow rates of 1 LPM or greater. °OXYGEN USE SAFETY TIPS °· Never smoke while using oxygen. Oxygen does not burn or explode, but flammable materials will burn faster in the presence of oxygen. °· Keep a fire extinguisher close by. Let your fire department know that you have oxygen in your home. °· Warn visitors not to smoke near you when you are using oxygen. Put up "no smoking" signs in your home where you most often use the oxygen. °· When you go to a restaurant with your portable oxygen source, ask to be seated in the nonsmoking section. °· Stay at least 5 feet away from gas  stoves, candles, lighted fireplaces, or other heat sources. °· Do not use materials that burn easily (flammable) while using your oxygen. °· If you use an oxygen cylinder, make sure it is secured to some fixed object or in a stand. If you use liquid oxygen, make sure the vessel is kept upright to keep the oxygen from pouring out. Liquid oxygen is so cold it can hurt your skin. °· If you use an oxygen concentrator, call your electric company so you will be given priority service if your power goes out. Avoid using extension cords, if possible. °· Regularly test your smoke detectors at home to make sure they work. If you receive care in your home from a nurse or other health care provider, he or she may also check to make sure your smoke detectors work. °  GUIDELINES FOR CLEANING YOUR EQUIPMENT °· Wash the nasal prongs with a liquid soap. Thoroughly rinse them once or twice a week. °· Replace the prongs every 2 to 4 weeks. If you have an infection (cold, pneumonia) change them when you are well. °· Your health care provider will give you instructions on how to clean your transtracheal catheter. °· The humidifier bottle should be washed with soap and warm water and rinsed thoroughly between each refill. Air-dry the bottle before filling it with sterile or distilled water. The bottle and its top should be disinfected after they are cleaned. °· If you use an oxygen concentrator, unplug the unit. Then wipe down the cabinet with a damp cloth and dry it daily. The air filter should be cleaned at least twice a week. °· Follow your home medical equipment and service company's directions for cleaning the compressor filter. °HOME CARE INSTRUCTIONS  °· Do not change the flow of oxygen unless directed by your health care provider. °· Do not use alcohol or other sedating drugs unless instructed. They slow your breathing rate. °· Do not use materials that burn easily (flammable) while using your oxygen. °· Always keep a spare tank of  oxygen. Plan ahead for holidays when you may not be able to get a prescription filled. °· Use water-based lubricants on your lips or nostrils. Do not use an oil-based product like petroleum jelly. °· To prevent your cheeks or the skin behind your ears from becoming irritated, tuck some gauze under the tubing. °· If you have persistent redness under your nose, call your health care provider. °· When you no longer need oxygen, your doctor will have the oxygen discontinued. Oxygen is not addicting or habit forming. °· Use the oxygen as instructed. Too much oxygen can be harmful and too little will not give you the benefit you need. °· Shortness of breath is not always from a lack of oxygen. If your oxygen level is not the cause of your shortness of breath, taking oxygen will not help. °SEEK MEDICAL CARE IF:  °· You have frequent headaches. °· You have shortness of breath or a lasting cough. °· You have anxiety. °· You are confused. °· You are drowsy or sleepy all the time. °· You develop an illness which aggravates your breathing. °· You cannot exercise. °· You are restless. °· You have blue lips or fingernails. °· You have difficult or irregular breathing and it is getting worse. °· You have a fever. °Document Released: 01/20/2004 Document Revised: 03/16/2014 Document Reviewed: 06/11/2013 °ExitCare® Patient Information ©2015 [employer]. This information is not intended to replace advice given to you by your health care provider. Make sure you discuss any questions you have with your health care provider. ° ° °

## 2015-03-24 NOTE — Progress Notes (Signed)
Pt is resting in room and has been ambulating in room. Pt has had no complaints this am. Dr. Sharon SellerMCClung is discharging pt to home.  Nurse entered pt's room and he had all his heart monitor leads pulled off. Notified telemetry ok to d/c him from tele monitoring. Pt has to wait for home oxygen to be set up prior to leaving. Case manager is working on this now.

## 2015-03-24 NOTE — Progress Notes (Signed)
Entered pt's room and pt stated he had to go and his taxi was here. Nurse educated him on the process of discharge again and stated she would work on his paperwork but asked him to be patient as oxygen just came. Pt was anxious to leave hospital since MD stated he could go in am but nurse had to wait for home health to be set up and oxygen to arrive. Pt now has oxygen tank.  Nurse working on paperwork and educated pt on oxygen tank, d/c papers and appointments he had to make. Pt assured nurse he had an appointment with his PCP already and would follow up. Nurse educated pt that if he felt like he did when he came to ED to call PCP or go to ED again. Nurse educated pt on if his legs swelled up or if he felt SOB that he needed to call PCP or go to ED. Pt stated he understood and had no questions. Nurse removed pt's IV to rt arm, tip intact and pt tolerated well. Nurse assistant taking pt to d/c doors.

## 2015-03-24 NOTE — Discharge Summary (Signed)
DISCHARGE SUMMARY  Richard Collier  MR#: [id number]  DOB:09-17-1941  Date of Admission: 03/19/2015 Date of Discharge: 03/24/2015  Attending Physician:Martine Trageser T  Patient's WUJ:WJXBJ,YNW, MD  Consults:  none  Disposition: D/C home   Follow-up Appts:     Follow-up Information    Follow up with [facility], MD In 1 week.   Specialty:  Family Medicine   Contact information:   [address] [facility] [place] [address] [phone]      Tests Needing Follow-up: -recheck of Na is suggested -evaluation or renal function is suggested -assessment of DM control is suggested  Discharge Diagnoses: Acute mental status change Acute Hyponatremia - idiopathic Hypotension Acute resp failure w/ hypoxia / COPD exacerbation Abd Pain  Hypothyroidism Acute on chronic renal failure (baselineCr 1.23-1.36) Diabetes type 2 controlled  Initial presentation: 74 y.o. year M Hx DM, HTN, HLD, and GERD who presented with hyponatremia, dyspnea, and abd pain. Pt was admitted 5/3-5/4 for acute resp failure w/ hypoxia in setting of asthma exacerbation. Pt was discharged from hospital w/ rx for prednisone and levaquin.   In the ER he was found to have a Na of 119, and was confused. CXR w/ persistent mild interstitial prominence in lung bases w/o focal consolidation.  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 and if this is significantly diminished could be helpful  Acute resp failure w/ hypoxia / COPD exacerbation -Continued levofloxacin to complete 5 day course  -supplemental O2 to maintain SPO2 89-93% - pt qualified for chronic home O2 - arrangements made for 2LPM at all times prior to d/c -Well compensated today - stop steroids and follow  Abd Pain  -Resolved -CT abd and pelvis; initial showed distended bladder with mild hydronephrosis   Hypothyroidism -TSH within normal limits -cont synthroid 50 g daily  Acute on chronic renal failure (baselineCr 1.23-1.36) -cr slightly elevated above baseline but stable - continue to monitor  Diabetes type 2 controlled -5/6 A1c 6.4  -Continue sensitive SSI    Medication List    STOP taking these medications        escitalopram 10 MG tablet  Commonly known as:  LEXAPRO     levofloxacin 750 MG tablet  Commonly known as:  LEVAQUIN     lisinopril-hydrochlorothiazide 10-12.5 MG per tablet  Commonly known as:  PRINZIDE,ZESTORETIC     predniSONE 10 MG tablet  Commonly known as:  DELTASONE      TAKE these medications        albuterol 108 (90 BASE) MCG/ACT inhaler  Commonly known as:  PROVENTIL HFA;VENTOLIN HFA  Inhale 2 puffs into the lungs every 6 (six) hours as needed for wheezing or shortness of breath (Use EITHER the inhaler OR the nebulizer, but NOT BOTH).     albuterol (2.5 MG/3ML) 0.083%  nebulizer solution  Commonly known as:  PROVENTIL  Take 3 mLs (2.5 mg total) by nebulization every 2 (two) hours as needed for wheezing (Use EITHER the inhaler OR the nebulizer, but NOT BOTH).     aspirin EC 81 MG tablet  Take 81 mg by mouth daily.     azelastine 0.1 % nasal spray  Commonly known as:  ASTELIN  Place 2 sprays  into the nose daily. Use in each nostril as directed     Fluticasone-Salmeterol 250-50 MCG/DOSE Aepb  Commonly known as:  ADVAIR DISKUS  Inhale 1 puff into the lungs 2 (two) times daily.     guaiFENesin-codeine 100-10 MG/5ML syrup  Commonly known as:  ROBITUSSIN AC  Take 5 mLs by mouth 3 (three) times daily as needed for cough.     ipratropium 0.03 % nasal spray  Commonly known as:  ATROVENT  Place 2 sprays into the nose 3 (three) times daily.     levothyroxine 50 MCG tablet  Commonly known as:  SYNTHROID, LEVOTHROID  Take 50 mcg by mouth at bedtime.     lovastatin 20 MG tablet  Commonly known as:  MEVACOR  Take 20 mg by mouth at bedtime.     metFORMIN 500 MG tablet  Commonly known as:  GLUCOPHAGE  Take 500 mg by mouth daily.     OMEGA-3 FATTY ACIDS-VITAMIN E PO  Take 1,000 mg by mouth 2 (two) times daily.     omeprazole 40 MG capsule  Commonly known as:  PRILOSEC  Take 40 mg by mouth daily. 30 minutes before a meal.     STOOL SOFTENER PO  Take 1 capsule by mouth daily.     zolpidem 10 MG tablet  Commonly known as:  AMBIEN  Take 10 mg by mouth at bedtime as needed. For sleep        Day of Discharge BP 127/73 mmHg  Pulse 93  Temp(Src) 98.2 F (36.8 C) (Oral)  Resp 16  Ht 5\' 11"  (1.803 m)  Wt 87.544 kg (193 lb)  BMI 26.93 kg/m2  SpO2 92%  Physical Exam: General: No acute respiratory distress Lungs: Clear to auscultation bilaterally without wheezes or crackles - on 2L [place] w/ sat 94% Cardiovascular: Regular rate and rhythm without murmur gallop or rub normal S1 and S2 Abdomen: Nontender, nondistended, soft, bowel sounds positive, no rebound, no ascites, no appreciable mass Extremities: No significant cyanosis, clubbing, or edema bilateral lower extremities  Basic Metabolic Panel:  Recent Labs Lab 03/21/15 0615 03/21/15 0821 03/21/15 1235 03/21/15 2106 03/22/15 0313 03/23/15 0412  NA 118* 119* 119* 123*  --  127*  K 3.4* 3.3* 3.7 3.7  --  3.7  CL 84*  84* 84* 85*  --  92*  CO2 23 22 24 25   --  24  GLUCOSE 100* 103* 157* 158*  --  103*  BUN 28* 28* 29* 31*  --  37*  CREATININE 1.46* 1.51* 1.58* 1.60*  --  1.52*  CALCIUM 8.3* 8.4* 8.1* 8.7*  --  8.4*  MG 1.9  --   --   --  2.1  --     Liver Function Tests:  Recent Labs Lab 03/21/15 0130 03/21/15 0615 03/21/15 0821 03/21/15 1235 03/21/15 2106  AST 33 35 34 31 31  ALT 28 28 27 27 29   ALKPHOS 57 55 59 57 65  BILITOT 0.6 0.6 0.7 0.6 0.6  PROT 5.7* 5.6* 5.9* 5.8* 6.8  ALBUMIN 2.8* 2.9* 3.0* 2.9* 3.2*  CBC:  Recent Labs Lab 03/19/15 0830 03/19/15 1552 03/20/15 0255 03/21/15 0615 03/22/15 0313 03/23/15 0412  WBC 25.7* 19.4* 23.9* 25.1* 29.8* 28.8*  NEUTROABS 20.8*  --  17.7* 18.1* 21.7*  --   HGB 11.6* 10.9* 11.3* 10.8* 11.1* 10.6*  HCT 32.3* 31.1* 31.6* 30.4* 30.6* 30.7*  MCV 84.8 84.1 82.9 83.5 84.8 86.2  PLT 318 272 307 258 303 335    Cardiac Enzymes:  Recent Labs Lab 03/19/15 0830  TROPONINI <0.03   BNP (last 3 results)  Recent Labs  03/17/15 0630 03/19/15 0830  BNP 56.6 32.9    CBG:  Recent Labs Lab 03/23/15 0823 03/23/15 1138 03/23/15 1603 03/23/15 2142 03/24/15 0810  GLUCAP 104* 122* 118* 149* 95    Recent Results (from the past 240 hour(s))  MRSA PCR Screening     Status: None   Collection Time: 03/17/15  3:06 AM  Result Value Ref Range Status   MRSA by PCR NEGATIVE NEGATIVE Final    Comment:        The GeneXpert MRSA Assay (FDA approved for NASAL specimens only), is one component of a comprehensive MRSA colonization surveillance program. It is not intended to diagnose MRSA infection nor to guide or monitor treatment for MRSA infections.   Culture, blood (routine x 2)     Status: None (Preliminary result)   Collection Time: 03/19/15  4:14 PM  Result Value Ref Range Status   Specimen Description BLOOD LEFT ANTECUBITAL  Final   Special Requests BOTTLES DRAWN AEROBIC ONLY 7CC  Final   Culture   Final           BLOOD  CULTURE RECEIVED NO GROWTH TO DATE CULTURE WILL BE HELD FOR 5 DAYS BEFORE ISSUING A FINAL NEGATIVE REPORT [facility]    Report Status PENDING  Incomplete  Culture, blood (routine x 2)     Status: None (Preliminary result)   Collection Time: 03/19/15  4:20 PM  Result Value Ref Range Status   Specimen Description BLOOD BLOOD RIGHT HAND  Final   Special Requests BOTTLES DRAWN AEROBIC ONLY 8CC  Final   Culture   Final           BLOOD CULTURE RECEIVED NO GROWTH TO DATE CULTURE WILL BE HELD FOR 5 DAYS BEFORE ISSUING A FINAL NEGATIVE REPORT [facility]formed at [facility]    Report Status PENDING  Incomplete  Culture, Urine     Status: None   Collection Time: 03/19/15  7:06 PM  Result Value Ref Range Status   Specimen Description URINE, RANDOM  Final   Special Requests NONE  Final   Colony Count NO GROWTH Performed at [facility]   Final   Culture NO GROWTH Performed at [facility]   Final   Report Status 03/20/2015 FINAL  Final      Time spent in discharge (includes decision making & examination of pt): > 30 minutes  03/24/2015, 10:05 AM   Lonia BloodJeffrey T. Ryett Hamman, MD [facility] Office  [phone] Pager [phone]  On-Call/Text Page:      [email or web address]      password [email or web address]

## 2015-03-25 LAB — CULTURE, BLOOD (ROUTINE X 2)
Culture: NO GROWTH
Culture: NO GROWTH

## 2015-04-03 ENCOUNTER — Emergency Department (HOSPITAL_BASED_OUTPATIENT_CLINIC_OR_DEPARTMENT_OTHER): Payer: Medicare Other

## 2015-04-03 ENCOUNTER — Encounter (HOSPITAL_BASED_OUTPATIENT_CLINIC_OR_DEPARTMENT_OTHER): Payer: Self-pay | Admitting: *Deleted

## 2015-04-03 ENCOUNTER — Emergency Department (HOSPITAL_BASED_OUTPATIENT_CLINIC_OR_DEPARTMENT_OTHER)
Admission: EM | Admit: 2015-04-03 | Discharge: 2015-04-03 | Disposition: A | Discharge status: Home / Self Care | Payer: Medicare Other | Attending: Emergency Medicine | Admitting: Emergency Medicine

## 2015-04-03 DIAGNOSIS — E780 Pure hypercholesterolemia: Secondary | ICD-10-CM | POA: Diagnosis not present

## 2015-04-03 DIAGNOSIS — R05 Cough: Secondary | ICD-10-CM | POA: Insufficient documentation

## 2015-04-03 DIAGNOSIS — Z79899 Other long term (current) drug therapy: Secondary | ICD-10-CM | POA: Insufficient documentation

## 2015-04-03 DIAGNOSIS — Z8701 Personal history of pneumonia (recurrent): Secondary | ICD-10-CM | POA: Diagnosis not present

## 2015-04-03 DIAGNOSIS — M1990 Unspecified osteoarthritis, unspecified site: Secondary | ICD-10-CM | POA: Insufficient documentation

## 2015-04-03 DIAGNOSIS — E119 Type 2 diabetes mellitus without complications: Secondary | ICD-10-CM | POA: Insufficient documentation

## 2015-04-03 DIAGNOSIS — Z7982 Long term (current) use of aspirin: Secondary | ICD-10-CM | POA: Diagnosis not present

## 2015-04-03 DIAGNOSIS — Z87891 Personal history of nicotine dependence: Secondary | ICD-10-CM | POA: Insufficient documentation

## 2015-04-03 DIAGNOSIS — K219 Gastro-esophageal reflux disease without esophagitis: Secondary | ICD-10-CM | POA: Diagnosis not present

## 2015-04-03 DIAGNOSIS — K625 Hemorrhage of anus and rectum: Secondary | ICD-10-CM | POA: Insufficient documentation

## 2015-04-03 DIAGNOSIS — I10 Essential (primary) hypertension: Secondary | ICD-10-CM | POA: Insufficient documentation

## 2015-04-03 DIAGNOSIS — Z7951 Long term (current) use of inhaled steroids: Secondary | ICD-10-CM | POA: Insufficient documentation

## 2015-04-03 DIAGNOSIS — R109 Unspecified abdominal pain: Secondary | ICD-10-CM

## 2015-04-03 DIAGNOSIS — R1012 Left upper quadrant pain: Secondary | ICD-10-CM | POA: Diagnosis present

## 2015-04-03 HISTORY — DX: Pneumonia, unspecified organism: J18.9

## 2015-04-03 LAB — CBC WITH DIFFERENTIAL/PLATELET
BASOS ABS: 0 10*3/uL (ref 0.0–0.1)
Band Neutrophils: 4 % (ref 0–10)
Basophils Relative: 0 % (ref 0–1)
EOS ABS: 0.3 10*3/uL (ref 0.0–0.7)
Eosinophils Relative: 4 % (ref 0–5)
HCT: 31.8 % — ABNORMAL LOW (ref 39.0–52.0)
Hemoglobin: 10.7 g/dL — ABNORMAL LOW (ref 13.0–17.0)
LYMPHS PCT: 22 % (ref 12–46)
Lymphs Abs: 1.7 10*3/uL (ref 0.7–4.0)
MCH: 30.2 pg (ref 26.0–34.0)
MCHC: 33.6 g/dL (ref 30.0–36.0)
MCV: 89.8 fL (ref 78.0–100.0)
MONO ABS: 0.4 10*3/uL (ref 0.1–1.0)
MONOS PCT: 5 % (ref 3–12)
Metamyelocytes Relative: 2 %
Myelocytes: 3 %
Neutro Abs: 5.5 10*3/uL (ref 1.7–7.7)
Neutrophils Relative %: 60 % (ref 43–77)
PLATELETS: 288 10*3/uL (ref 150–400)
RBC: 3.54 MIL/uL — ABNORMAL LOW (ref 4.22–5.81)
RDW: 15.1 % (ref 11.5–15.5)
WBC: 7.9 10*3/uL (ref 4.0–10.5)

## 2015-04-03 LAB — COMPREHENSIVE METABOLIC PANEL
ALBUMIN: 3.7 g/dL (ref 3.5–5.0)
ALT: 22 U/L (ref 17–63)
AST: 25 U/L (ref 15–41)
Alkaline Phosphatase: 66 U/L (ref 38–126)
Anion gap: 8 (ref 5–15)
BILIRUBIN TOTAL: 0.4 mg/dL (ref 0.3–1.2)
BUN: 18 mg/dL (ref 6–20)
CO2: 24 mmol/L (ref 22–32)
Calcium: 9.1 mg/dL (ref 8.9–10.3)
Chloride: 98 mmol/L — ABNORMAL LOW (ref 101–111)
Creatinine, Ser: 1.15 mg/dL (ref 0.61–1.24)
GFR calc Af Amer: >60 mL/min (ref >60)
GFR calc non Af Amer: >60 mL/min (ref >60)
Glucose, Bld: 112 mg/dL — ABNORMAL HIGH (ref 65–99)
Potassium: 4 mmol/L (ref 3.5–5.1)
SODIUM: 130 mmol/L — AB (ref 135–145)
Total Protein: 6.7 g/dL (ref 6.5–8.1)

## 2015-04-03 MED ORDER — ONDANSETRON 4 MG PO TBDP
4.0000 mg | ORAL_TABLET | Freq: Once | ORAL | Status: AC
  Administered 2015-04-03: 4 mg via ORAL
  Filled 2015-04-03: qty 1

## 2015-04-03 MED ORDER — OXYCODONE-ACETAMINOPHEN 5-325 MG PO TABS
1.0000 | ORAL_TABLET | Freq: Once | ORAL | Status: AC
  Administered 2015-04-03: 1 via ORAL
  Filled 2015-04-03: qty 1

## 2015-04-03 MED ORDER — HYDROCODONE-ACETAMINOPHEN 5-325 MG PO TABS: 1.0000 | ORAL_TABLET | ORAL | Status: AC | PRN

## 2015-04-03 NOTE — Discharge Instructions (Signed)
Hematoma A hematoma is a collection of blood under the skin, in an organ, in a body space, in a joint space, or in other tissue. The blood can clot to form a lump that you can see and feel. The lump is often firm and may sometimes become sore and tender. Most hematomas get better in a few days to weeks. However, some hematomas may be serious and require medical care. Hematomas can range in size from very small to very large. CAUSES  A hematoma can be caused by a blunt or penetrating injury. It can also be caused by spontaneous leakage from a blood vessel under the skin. Spontaneous leakage from a blood vessel is more likely to occur in older people, especially those taking blood thinners. Sometimes, a hematoma can develop after certain medical procedures. SIGNS AND SYMPTOMS   A firm lump on the body.  Possible pain and tenderness in the area.  Bruising.Blue, dark blue, purple-red, or yellowish skin may appear at the site of the hematoma if the hematoma is close to the surface of the skin. For hematomas in deeper tissues or body spaces, the signs and symptoms may be subtle. For example, an intra-abdominal hematoma may cause abdominal pain, weakness, fainting, and shortness of breath. An intracranial hematoma may cause a headache or symptoms such as weakness, trouble speaking, or a change in consciousness. DIAGNOSIS  A hematoma can usually be diagnosed based on your medical history and a physical exam. Imaging tests may be needed if your health care provider suspects a hematoma in deeper tissues or body spaces, such as the abdomen, head, or chest. These tests may include ultrasonography or a CT scan.  TREATMENT  Hematomas usually go away on their own over time. Rarely does the blood need to be drained out of the body. Large hematomas or those that may affect vital organs will sometimes need surgical drainage or monitoring. HOME CARE INSTRUCTIONS   Apply ice to the injured area:   Put ice in a  plastic bag.   Place a towel between your skin and the bag.   Leave the ice on for 20 minutes, 2-3 times a day for the first 1 to 2 days.   After the first 2 days, switch to using warm compresses on the hematoma.   Elevate the injured area to help decrease pain and swelling. Wrapping the area with an elastic bandage may also be helpful. Compression helps to reduce swelling and promotes shrinking of the hematoma. Make sure the bandage is not wrapped too tight.   If your hematoma is on a lower extremity and is painful, crutches may be helpful for a couple days.   Only take over-the-counter or prescription medicines as directed by your health care provider. SEEK IMMEDIATE MEDICAL CARE IF:   You have increasing pain, or your pain is not controlled with medicine.   You have a fever.   You have worsening swelling or discoloration.   Your skin over the hematoma breaks or starts bleeding.   Your hematoma is in your chest or abdomen and you have weakness, shortness of breath, or a change in consciousness.  Your hematoma is on your scalp (caused by a fall or injury) and you have a worsening headache or a change in alertness or consciousness. MAKE SURE YOU:   Understand these instructions.  Will watch your condition.  Will get help right away if you are not doing well or get worse. Document Released: 06/13/2004 Document Revised: 07/02/2013 Document Reviewed: 04/09/2013  ExitCare Patient Information 2015 [place], [place]. This information is not intended to replace advice given to you by your health care provider. Make sure you discuss any questions you have with your health care provider.  Rectal Bleeding Rectal bleeding is when blood passes out of the anus. It is usually a sign that something is wrong. It may not be serious, but it should always be evaluated. Rectal bleeding may present as bright red blood or extremely dark stools. The color may range from dark red or maroon to  black (like tar). It is important that the cause of rectal bleeding be identified so treatment can be started and the problem corrected. CAUSES   Hemorrhoids. These are enlarged (dilated) blood vessels or veins in the anal or rectal area.  Fistulas. Theseare abnormal, burrowing channels that usually run from inside the rectum to the skin around the anus. They can bleed.  Anal fissures. This is a tear in the tissue of the anus. Bleeding occurs with bowel movements.  Diverticulosis. This is a condition in which pockets or sacs project from the bowel wall. Occasionally, the sacs can bleed.  Diverticulitis. Thisis an infection involving diverticulosis of the colon.  Proctitis and colitis. These are conditions in which the rectum, colon, or both, can become inflamed and pitted (ulcerated).  Polyps and cancer. Polyps are non-cancerous (benign) growths in the colon that may bleed. Certain types of polyps turn into cancer.  Protrusion of the rectum. Part of the rectum can project from the anus and bleed.  Certain medicines.  Intestinal infections.  Blood vessel abnormalities. HOME CARE INSTRUCTIONS  Eat a high-fiber diet to keep your stool soft.  Limit activity.  Drink enough fluids to keep your urine clear or pale yellow.  Warm baths may be useful to soothe rectal pain.  Follow up with your caregiver as directed. SEEK IMMEDIATE MEDICAL CARE IF:  You develop increased bleeding.  You have black or dark red stools.  You vomit blood or material that looks like coffee grounds.  You have abdominal pain or tenderness.  You have a fever.  You feel weak, nauseous, or you faint.  You have severe rectal pain or you are unable to have a bowel movement. MAKE SURE YOU:  Understand these instructions.  Will watch your condition.  Will get help right away if you are not doing well or get worse. Document Released: 04/21/2002 Document Revised: 01/22/2012 Document Reviewed:  04/16/2011 [facility] Patient Information 2015 [place], [place]. This information is not intended to replace advice given to you by your health care provider. Make sure you discuss any questions you have with your health care provider.
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
## 2015-04-03 NOTE — ED Notes (Signed)
Patient c/o left upper abd pain/tenderness that started last night around 10pm. Recently admitted to [facility] for pneumonia

## 2015-06-18 ENCOUNTER — Encounter (HOSPITAL_BASED_OUTPATIENT_CLINIC_OR_DEPARTMENT_OTHER): Payer: Self-pay | Admitting: *Deleted

## 2016-09-29 IMAGING — NM NM PULMONARY VENT & PERF
12 series · 12 of 12 positions shown · non-contrast
Comparison: Chest CTA 03/19/2015.  Chest radiograph 03/19/2015.

CLINICAL DATA: Pulmonary embolism.  dyspnea/short of breath.

EXAM:
NUCLEAR MEDICINE VENTILATION - PERFUSION LUNG SCAN
TECHNIQUE: Ventilation images were obtained in multiple projections using
inhaled aerosol Ac-VVm DTPA. Perfusion images were obtained in
multiple projections after intravenous injection of Ac-VVm MAA.
RADIOPHARMACEUTICALS:  40 mCi Gechnetium-44m DTPA aerosol inhalation
and 6 mCi Gechnetium-44m MAA IV

[Series 1: ant/post vent · 4.14mm/px · 1 of 1 slices shown (1 of 2)]
[im 1/1]
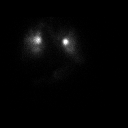

[Series 1: ant/post vent · 4.14mm/px · 1 of 1 slices shown (2 of 2)]
[im 1/1]
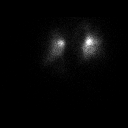

[Series 2: lao/rpo vent · 4.14mm/px · 1 of 1 slices shown (1 of 2)]
[im 1/1]
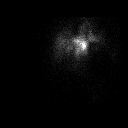

[Series 2: lao/rpo vent · 4.14mm/px · 1 of 1 slices shown (2 of 2)]
[im 1/1]
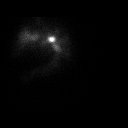

[Series 3: lpo/rao vent · 4.14mm/px · 1 of 1 slices shown (1 of 2)]
[im 1/1]
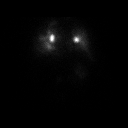

[Series 3: lpo/rao vent · 4.14mm/px · 1 of 1 slices shown (2 of 2)]
[im 1/1]
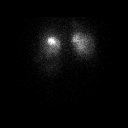

[Series 4: lpo/rao perf · 4.14mm/px · 1 of 1 slices shown (1 of 2)]
[im 1/1]
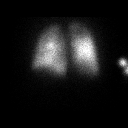

[Series 4: lpo/rao perf · 4.14mm/px · 1 of 1 slices shown (2 of 2)]
[im 1/1]
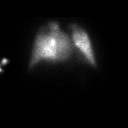

[Series 5: ant/post perf · 4.14mm/px · 1 of 1 slices shown (1 of 2)]
[im 1/1]
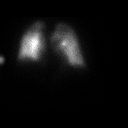

[Series 5: ant/post perf · 4.14mm/px · 1 of 1 slices shown (2 of 2)]
[im 1/1]
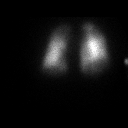

[Series 6: lao/rpo perf · 4.14mm/px · 1 of 1 slices shown (1 of 2)]
[im 1/1]
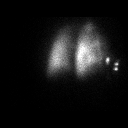

[Series 6: lao/rpo perf · 4.14mm/px · 1 of 1 slices shown (2 of 2)]
[im 1/1]
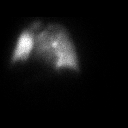

[12 of 12 positions shown; findings below may reference images not displayed]

FINDINGS: Ventilation: Heterogeneous perfusion is present with clumping of
radiotracer. There also is ingested or free radiotracer in the
stomach.

Perfusion: No wedge shaped peripheral perfusion defects to suggest
acute pulmonary embolism. Areas of heterogeneous perfusion are
present.

Study is degraded because lateral projections were unable the be
obtained. The study is diagnostic.
IMPRESSION: Low probability for pulmonary embolus.

## 2017-06-19 ENCOUNTER — Encounter (HOSPITAL_BASED_OUTPATIENT_CLINIC_OR_DEPARTMENT_OTHER): Payer: Self-pay | Admitting: Emergency Medicine

## 2017-06-19 ENCOUNTER — Emergency Department (HOSPITAL_BASED_OUTPATIENT_CLINIC_OR_DEPARTMENT_OTHER): Payer: Medicare Other

## 2017-06-19 ENCOUNTER — Emergency Department (HOSPITAL_BASED_OUTPATIENT_CLINIC_OR_DEPARTMENT_OTHER)
Admission: EM | Admit: 2017-06-19 | Discharge: 2017-06-19 | Disposition: A | Discharge status: Home / Self Care | Payer: Medicare Other | Attending: Emergency Medicine | Admitting: Emergency Medicine

## 2017-06-19 DIAGNOSIS — Z7982 Long term (current) use of aspirin: Secondary | ICD-10-CM | POA: Diagnosis not present

## 2017-06-19 DIAGNOSIS — F410 Panic disorder [episodic paroxysmal anxiety] without agoraphobia: Secondary | ICD-10-CM | POA: Diagnosis not present

## 2017-06-19 DIAGNOSIS — Z7984 Long term (current) use of oral hypoglycemic drugs: Secondary | ICD-10-CM | POA: Insufficient documentation

## 2017-06-19 DIAGNOSIS — R51 Headache: Secondary | ICD-10-CM | POA: Diagnosis not present

## 2017-06-19 DIAGNOSIS — J45909 Unspecified asthma, uncomplicated: Secondary | ICD-10-CM | POA: Diagnosis not present

## 2017-06-19 DIAGNOSIS — J449 Chronic obstructive pulmonary disease, unspecified: Secondary | ICD-10-CM | POA: Insufficient documentation

## 2017-06-19 DIAGNOSIS — E119 Type 2 diabetes mellitus without complications: Secondary | ICD-10-CM | POA: Diagnosis not present

## 2017-06-19 DIAGNOSIS — Z79899 Other long term (current) drug therapy: Secondary | ICD-10-CM | POA: Diagnosis not present

## 2017-06-19 DIAGNOSIS — I10 Essential (primary) hypertension: Secondary | ICD-10-CM | POA: Insufficient documentation

## 2017-06-19 DIAGNOSIS — Z87891 Personal history of nicotine dependence: Secondary | ICD-10-CM | POA: Insufficient documentation

## 2017-06-19 HISTORY — DX: Chronic obstructive pulmonary disease, unspecified: J44.9
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
## 2019-04-22 ENCOUNTER — Encounter (HOSPITAL_BASED_OUTPATIENT_CLINIC_OR_DEPARTMENT_OTHER): Payer: Self-pay | Admitting: Emergency Medicine

## 2019-04-22 ENCOUNTER — Other Ambulatory Visit: Payer: Self-pay

## 2019-04-22 ENCOUNTER — Emergency Department (HOSPITAL_BASED_OUTPATIENT_CLINIC_OR_DEPARTMENT_OTHER)
Admission: EM | Admit: 2019-04-22 | Discharge: 2019-04-22 | Disposition: A | Discharge status: Home / Self Care | Payer: Medicare Other | Attending: Emergency Medicine | Admitting: Emergency Medicine

## 2019-04-22 ENCOUNTER — Emergency Department (HOSPITAL_BASED_OUTPATIENT_CLINIC_OR_DEPARTMENT_OTHER): Payer: Medicare Other

## 2019-04-22 DIAGNOSIS — I10 Essential (primary) hypertension: Secondary | ICD-10-CM | POA: Insufficient documentation

## 2019-04-22 DIAGNOSIS — S2242XA Multiple fractures of ribs, left side, initial encounter for closed fracture: Secondary | ICD-10-CM | POA: Diagnosis not present

## 2019-04-22 DIAGNOSIS — Y92480 Sidewalk as the place of occurrence of the external cause: Secondary | ICD-10-CM | POA: Diagnosis not present

## 2019-04-22 DIAGNOSIS — Z87891 Personal history of nicotine dependence: Secondary | ICD-10-CM | POA: Insufficient documentation

## 2019-04-22 DIAGNOSIS — W010XXA Fall on same level from slipping, tripping and stumbling without subsequent striking against object, initial encounter: Secondary | ICD-10-CM | POA: Insufficient documentation

## 2019-04-22 DIAGNOSIS — S299XXA Unspecified injury of thorax, initial encounter: Secondary | ICD-10-CM | POA: Diagnosis present

## 2019-04-22 DIAGNOSIS — E039 Hypothyroidism, unspecified: Secondary | ICD-10-CM | POA: Diagnosis not present

## 2019-04-22 DIAGNOSIS — S50312A Abrasion of left elbow, initial encounter: Secondary | ICD-10-CM | POA: Diagnosis not present

## 2019-04-22 DIAGNOSIS — E119 Type 2 diabetes mellitus without complications: Secondary | ICD-10-CM | POA: Diagnosis not present

## 2019-04-22 DIAGNOSIS — J449 Chronic obstructive pulmonary disease, unspecified: Secondary | ICD-10-CM | POA: Diagnosis not present

## 2019-04-22 DIAGNOSIS — Y999 Unspecified external cause status: Secondary | ICD-10-CM | POA: Insufficient documentation

## 2019-04-22 DIAGNOSIS — Y9301 Activity, walking, marching and hiking: Secondary | ICD-10-CM | POA: Diagnosis not present

## 2019-04-22 DIAGNOSIS — Z7982 Long term (current) use of aspirin: Secondary | ICD-10-CM | POA: Insufficient documentation

## 2019-04-22 DIAGNOSIS — Z7984 Long term (current) use of oral hypoglycemic drugs: Secondary | ICD-10-CM | POA: Diagnosis not present

## 2019-04-22 MED ORDER — OXYCODONE HCL 5 MG PO TABS
5.0000 mg | ORAL_TABLET | Freq: Once | ORAL | Status: AC
  Administered 2019-04-22: 5 mg via ORAL
  Filled 2019-04-22: qty 1

## 2019-04-22 MED ORDER — MORPHINE SULFATE 15 MG PO TABS: 15.0000 mg | ORAL_TABLET | ORAL | 0 refills | Status: AC | PRN

## 2019-04-22 MED ORDER — ACETAMINOPHEN 500 MG PO TABS
1000.0000 mg | ORAL_TABLET | Freq: Once | ORAL | Status: AC
  Administered 2019-04-22: 1000 mg via ORAL
  Filled 2019-04-22: qty 2

## 2019-04-22 MED ORDER — DICLOFENAC SODIUM 1 % TD GEL: 4.0000 g | Freq: Four times a day (QID) | TRANSDERMAL | 0 refills | Status: AC

## 2019-04-22 NOTE — ED Provider Notes (Signed)
[facility] EMERGENCY DEPARTMENT Provider Note   CSN: [id number] Arrival date & time: 04/22/19  2202    History   [id number] Complaint Chief Complaint  Patient presents with  . Fall    HPI Glynn OctaveRaymond Hollyfield is a 78 y.o. male.     78 yo M with a cc of left chest wall pain.  Post fall, tripped over his dog.  Small abrasion to left elbow.  No abdominal pain, no headache, no neck pain, no back pain. TDap up to date.   The history is provided by the patient.  Fall  This is a new problem. The current episode started 3 to 5 hours ago. The problem occurs constantly. The problem has not changed since onset.Associated symptoms include chest pain. Pertinent negatives include no abdominal pain, no headaches and no shortness of breath. Nothing aggravates the symptoms. Nothing relieves the symptoms. He has tried nothing for the symptoms. The treatment provided no relief.    Past Medical History:  Diagnosis Date  . Acid reflux   . Arthritis   . COPD (chronic obstructive pulmonary disease) (HCC)   . Diabetes mellitus   . High cholesterol   . Hypertension   . Pneumonia     Patient Active Problem List   Diagnosis Date Noted  . Other specified hypotension   . Disorientation   . Acute on chronic respiratory failure with hypoxia (HCC)   . COPD exacerbation (HCC)   . Generalized abdominal pain   . Other specified hypothyroidism   . Acute on chronic renal failure (HCC)   . Diabetes type 2, controlled (HCC)   . Leukocytosis   . Hyponatremia 03/19/2015  . AKI (acute kidney injury) (HCC) 03/19/2015  . Abdominal pain 03/19/2015  . Productive cough 03/17/2015  . Acute respiratory failure with hypoxia (HCC) 03/17/2015  . Hypothyroidism 03/17/2015  . Essential hypertension 03/17/2015  . Diabetes mellitus type 2, controlled (HCC) 03/17/2015  . Normocytic anemia 03/17/2015  . Hyperlipidemia 03/17/2015  . Acute respiratory failure (HCC) 03/17/2015  . Asthma exacerbation 03/16/2015     Past Surgical History:  Procedure Laterality Date  . COLONOSCOPY    . PROSTATE SURGERY  Sept. 5 2013        Home Medications    Prior to Admission medications   Medication Sig Start Date End Date Taking? Authorizing Provider  albuterol (PROVENTIL HFA;VENTOLIN HFA) 108 (90 BASE) MCG/ACT inhaler Inhale 2 puffs into the lungs every 6 (six) hours as needed for wheezing or shortness of breath (Use EITHER the inhaler OR the nebulizer, but NOT BOTH). 03/24/15   Lonia BloodMcClung, Jeffrey T, MD  albuterol (PROVENTIL) (2.5 MG/3ML) 0.083% nebulizer solution Take 3 mLs (2.5 mg total) by nebulization every 2 (two) hours as needed for wheezing (Use EITHER the inhaler OR the nebulizer, but NOT BOTH). 03/24/15   Lonia BloodMcClung, Jeffrey T, MD  aspirin EC 81 MG tablet Take 81 mg by mouth daily. 02/16/12   [provider]  azelastine (ASTELIN) 137 MCG/SPRAY nasal spray Place 2 sprays into the nose daily. Use in each nostril as directed    [provider]  diclofenac sodium (VOLTAREN) 1 % GEL Apply 4 g topically 4 (four) times daily. 04/22/19   Melene PlanFloyd, Mikenzi Raysor, DO  Docusate Calcium (STOOL SOFTENER PO) Take 1 capsule by mouth daily.    [provider]  Fluticasone-Salmeterol (ADVAIR DISKUS) 250-50 MCG/DOSE AEPB Inhale 1 puff into the lungs 2 (two) times daily. 03/17/15   Ghimire, Werner LeanShanker M, MD  guaiFENesin-codeine (ROBITUSSIN AC) 100-10  MG/5ML syrup Take 5 mLs by mouth 3 (three) times daily as needed for cough. 03/17/15   Ghimire, Werner LeanShanker M, MD  HYDROcodone-acetaminophen (NORCO/VICODIN) 5-325 MG per tablet Take 1 tablet by mouth every 4 (four) hours as needed. 04/03/15   Rolan BuccoBelfi, Melanie, MD  ipratropium (ATROVENT) 0.03 % nasal spray Place 2 sprays into the nose 3 (three) times daily. 02/17/15   [provider]  levothyroxine (SYNTHROID, LEVOTHROID) 50 MCG tablet Take 50 mcg by mouth at bedtime.     [provider]  lovastatin (MEVACOR) 20 MG tablet Take 20 mg by mouth at bedtime.    [provider]  metFORMIN (GLUCOPHAGE) 500 MG tablet Take 500 mg by mouth daily.    [provider]  morphine (MSIR) 15 MG tablet Take 1 tablet (15 mg total) by mouth every 4 (four) hours as needed for severe pain. 04/22/19   Melene PlanFloyd, Kerrianne Jeng, DO  OMEGA-3 FATTY ACIDS-VITAMIN E PO Take 1,000 mg by mouth 2 (two) times daily. 02/16/12   [provider]  omeprazole (PRILOSEC) 40 MG capsule Take 80 mg by mouth daily. 30 minutes before a meal. 11/09/14   [provider]  zolpidem (AMBIEN) 10 MG tablet Take 10 mg by mouth at bedtime as needed. For sleep    [provider]    Family History Family History  Problem Relation Age of Onset  . Diabetes Mellitus II Father   . CAD Sister     Social History Social History   Tobacco Use  . Smoking status: Former Smoker    Packs/day: 3.00    Years: 25.00    Pack years: 75.00    Types: Cigarettes    Last attempt to quit: 07/22/1983    Years since quitting: 35.7  . Smokeless tobacco: Never Used  Substance Use Topics  . Alcohol use: No  . Drug use: No     Allergies   Sulfa antibiotics and Sulfa antibiotics   Review of Systems Review of Systems  Constitutional: Negative for chills and fever.  HENT: Negative for congestion and facial swelling.   Eyes: Negative for discharge and visual disturbance.  Respiratory: Negative for shortness of breath.   Cardiovascular: Positive for chest pain. Negative for palpitations.  Gastrointestinal: Negative for abdominal pain, diarrhea and vomiting.  Musculoskeletal: Negative for arthralgias and myalgias.  Skin: Negative for color change and rash.  Neurological: Negative for tremors, syncope and headaches.  Psychiatric/Behavioral: Negative for confusion and dysphoric mood.     Physical Exam Updated Vital Signs BP (!) 154/104 (BP Location: Right Arm)   Pulse 80   Temp 98.4 F (36.9 C) (Oral)   Resp 18   Ht 5\' 8"  (1.727 m)   Wt 97.1 kg   SpO2 95%   BMI 32.54 kg/m    Physical Exam Vitals signs and nursing note reviewed.  Constitutional:      Appearance: He is well-developed.  HENT:     Head: Normocephalic and atraumatic.  Eyes:     Pupils: Pupils are equal, round, and reactive to light.  Neck:     Musculoskeletal: Normal range of motion and neck supple.     Vascular: No JVD.  Cardiovascular:     Rate and Rhythm: Normal rate and regular rhythm.     Heart sounds: No murmur. No friction rub. No gallop.   Pulmonary:     Effort: No respiratory distress.     Breath sounds: No wheezing.  Abdominal:     General: There is no  distension.     Tenderness: There is no guarding or rebound.  Musculoskeletal: Normal range of motion.     Comments: Abrasion to the left elbow.  Full ROM without significant tenderness.  Pain to the left rib margin about ribs 5-7. No midline spinal tenderness, palpated from head to toe without tenderness.   Skin:    Coloration: Skin is not pale.     Findings: No rash.  Neurological:     Mental Status: He is alert and oriented to person, place, and time.  Psychiatric:        Behavior: Behavior normal.      ED Treatments / Results  Labs (all labs ordered are listed, but only abnormal results are displayed) Labs Reviewed - No data to display  EKG None  Radiology Dg Ribs Unilateral W/chest Left  Result Date: 04/22/2019 CLINICAL DATA:  Left-sided chest wall pain after fall EXAM: LEFT RIBS AND CHEST - 3+ VIEW COMPARISON:  04/03/2015 FINDINGS: Single-view chest demonstrates hyperinflation with probable emphysematous disease and coarse chronic interstitial opacity at the bases. Normal heart size. No pneumothorax. Left rib series demonstrates acute mildly displaced left sixth and seventh rib fractures. IMPRESSION: 1. Negative for pneumothorax or pleural effusion. Emphysematous disease 2. Acute left sixth and seventh rib fractures Electronically Signed   By: Donavan Foil M.D.   On: 04/22/2019 22:47    Procedures Procedures  (including critical care time)  Medications Ordered in ED Medications  acetaminophen (TYLENOL) tablet 1,000 mg (has no administration in time range)  oxyCODONE (Oxy IR/ROXICODONE) immediate release tablet 5 mg (has no administration in time range)     Initial Impression / Assessment and Plan / ED Course  I have reviewed the triage vital signs and the nursing notes.  Pertinent labs & imaging results that were available during my care of the patient were reviewed by me and considered in my medical decision making (see chart for details).        78 yo M with a cc of L chest wall pain.  Post fall.  My view of cxr without ptx  Rib fractures are noted on x-ray.  Patient's pain appears to be well controlled here in the ED.  We will have him follow-up with his doctor in the office.  Incentive spirometry.  10:54 PM:  I have discussed the diagnosis/risks/treatment options with the patient and believe the pt to be eligible for discharge home to follow-up with PCP. We also discussed returning to the ED immediately if new or worsening sx occur. We discussed the sx which are most concerning (e.g., sudden worsening pain, fever, inability to tolerate by mouth) that necessitate immediate return. Medications administered to the patient during their visit and any new prescriptions provided to the patient are listed below.  Medications given during this visit Medications  acetaminophen (TYLENOL) tablet 1,000 mg (has no administration in time range)  oxyCODONE (Oxy IR/ROXICODONE) immediate release tablet 5 mg (has no administration in time range)     The patient appears reasonably screen and/or stabilized for discharge and I doubt any other medical condition or other [facility] requiring further screening, evaluation, or treatment in the ED at this time prior to discharge.    Final Clinical Impressions(s) / ED Diagnoses   Final diagnoses:  Closed fracture of multiple ribs of left side, initial encounter     ED Discharge Orders         Ordered    morphine (MSIR) 15 MG tablet  Every 4 hours PRN  04/22/19 2254    diclofenac sodium (VOLTAREN) 1 % GEL  4 times daily     04/22/19 2254           Melene PlanFloyd, Leevi Cullars, DO 04/22/19 2254

## 2019-04-22 NOTE — ED Triage Notes (Signed)
Pt states he was walking and tripped on a curb and fell injuring his ribs on the left side and his left elbow  Denies hitting head  Denies LOC  Pt states it hurts to breathe

## 2019-04-22 NOTE — Discharge Instructions (Signed)
Follow up with your family doc.  Return for worsening sob or fever.  Incentive spirometry 61min out of every hour while you are awake.
